# Patient Record
Sex: Female | Born: 1965 | Hispanic: Yes | Marital: Married | Smoking: Never smoker
Health system: Southern US, Community
[De-identification: ages and names within clinical notes are randomized; demographics above are authoritative.]

## PROBLEM LIST (undated history)

## (undated) DIAGNOSIS — M1990 Unspecified osteoarthritis, unspecified site: Secondary | ICD-10-CM

## (undated) DIAGNOSIS — F32A Depression, unspecified: Secondary | ICD-10-CM

## (undated) DIAGNOSIS — M549 Dorsalgia, unspecified: Secondary | ICD-10-CM

## (undated) DIAGNOSIS — F419 Anxiety disorder, unspecified: Secondary | ICD-10-CM

## (undated) DIAGNOSIS — E559 Vitamin D deficiency, unspecified: Secondary | ICD-10-CM

## (undated) DIAGNOSIS — M25579 Pain in unspecified ankle and joints of unspecified foot: Secondary | ICD-10-CM

## (undated) HISTORY — PX: FOOT SURGERY: SHX648

## (undated) HISTORY — PX: OTHER SURGICAL HISTORY: SHX169

## (undated) HISTORY — PX: TOOTH EXTRACTION: SUR596

---

## 2005-02-23 ENCOUNTER — Ambulatory Visit: Payer: Self-pay | Admitting: Family Medicine

## 2005-03-12 ENCOUNTER — Encounter: Payer: Self-pay | Admitting: Family Medicine

## 2005-04-08 ENCOUNTER — Encounter: Payer: Self-pay | Admitting: Family Medicine

## 2005-05-09 ENCOUNTER — Encounter: Payer: Self-pay | Admitting: Family Medicine

## 2005-07-31 ENCOUNTER — Ambulatory Visit: Payer: Self-pay | Admitting: Orthopedic Surgery

## 2005-08-24 ENCOUNTER — Encounter: Admission: RE | Admit: 2005-08-24 | Discharge: 2005-08-24 | Payer: Self-pay | Admitting: Neurological Surgery

## 2007-04-27 ENCOUNTER — Ambulatory Visit: Payer: Self-pay | Admitting: Emergency Medicine

## 2008-03-23 ENCOUNTER — Inpatient Hospital Stay: Payer: Self-pay | Admitting: Specialist

## 2008-04-07 ENCOUNTER — Emergency Department: Payer: Self-pay | Admitting: Emergency Medicine

## 2008-04-13 ENCOUNTER — Emergency Department: Payer: Self-pay | Admitting: Emergency Medicine

## 2009-03-27 ENCOUNTER — Ambulatory Visit: Payer: Self-pay | Admitting: Obstetrics and Gynecology

## 2011-04-08 ENCOUNTER — Ambulatory Visit: Payer: Self-pay | Admitting: Family Medicine

## 2014-05-27 ENCOUNTER — Ambulatory Visit
Admit: 2014-05-27 | Disposition: A | Discharge status: Home / Self Care | Payer: Self-pay | Attending: Family Medicine | Admitting: Family Medicine

## 2014-05-30 ENCOUNTER — Other Ambulatory Visit: Payer: Self-pay | Admitting: Family Medicine

## 2014-05-30 DIAGNOSIS — N63 Unspecified lump in unspecified breast: Secondary | ICD-10-CM

## 2014-06-17 ENCOUNTER — Ambulatory Visit
Admission: RE | Admit: 2014-06-17 | Discharge: 2014-06-17 | Disposition: A | Discharge status: Home / Self Care | Payer: BC Managed Care – PPO | Source: Ambulatory Visit | Attending: Family Medicine | Admitting: Family Medicine

## 2014-06-17 DIAGNOSIS — N63 Unspecified lump in unspecified breast: Secondary | ICD-10-CM

## 2014-06-17 DIAGNOSIS — R928 Other abnormal and inconclusive findings on diagnostic imaging of breast: Secondary | ICD-10-CM | POA: Diagnosis present

## 2015-09-11 ENCOUNTER — Encounter: Payer: Self-pay | Admitting: *Deleted

## 2015-09-12 ENCOUNTER — Encounter: Payer: Self-pay | Admitting: *Deleted

## 2015-09-12 ENCOUNTER — Encounter
Admission: RE | Disposition: A | Discharge status: Home / Self Care | Payer: Self-pay | Source: Ambulatory Visit | Attending: Gastroenterology

## 2015-09-12 ENCOUNTER — Ambulatory Visit: Payer: BC Managed Care – PPO | Admitting: Anesthesiology

## 2015-09-12 ENCOUNTER — Ambulatory Visit
Admission: RE | Admit: 2015-09-12 | Discharge: 2015-09-12 | Disposition: A | Discharge status: Home / Self Care | Payer: BC Managed Care – PPO | Source: Ambulatory Visit | Attending: Gastroenterology | Admitting: Gastroenterology

## 2015-09-12 DIAGNOSIS — K640 First degree hemorrhoids: Secondary | ICD-10-CM | POA: Insufficient documentation

## 2015-09-12 DIAGNOSIS — D120 Benign neoplasm of cecum: Secondary | ICD-10-CM | POA: Insufficient documentation

## 2015-09-12 DIAGNOSIS — Z1211 Encounter for screening for malignant neoplasm of colon: Secondary | ICD-10-CM | POA: Insufficient documentation

## 2015-09-12 DIAGNOSIS — E559 Vitamin D deficiency, unspecified: Secondary | ICD-10-CM | POA: Insufficient documentation

## 2015-09-12 HISTORY — PX: COLONOSCOPY WITH PROPOFOL: SHX5780

## 2015-09-12 HISTORY — DX: Pain in unspecified ankle and joints of unspecified foot: M25.579

## 2015-09-12 HISTORY — DX: Dorsalgia, unspecified: M54.9

## 2015-09-12 HISTORY — DX: Vitamin D deficiency, unspecified: E55.9

## 2015-09-12 SURGERY — COLONOSCOPY WITH PROPOFOL
Anesthesia: General

## 2015-09-12 MED ORDER — FENTANYL CITRATE (PF) 100 MCG/2ML IJ SOLN
INTRAMUSCULAR | Status: DC | PRN
  Administered 2015-09-12: 50 ug via INTRAVENOUS

## 2015-09-12 MED ORDER — SODIUM CHLORIDE 0.9 % IV SOLN
INTRAVENOUS | Status: DC
  Administered 2015-09-12: 1000 mL via INTRAVENOUS

## 2015-09-12 MED ORDER — EPHEDRINE SULFATE 50 MG/ML IJ SOLN
INTRAMUSCULAR | Status: DC | PRN
  Administered 2015-09-12 (×2): 5 mg via INTRAVENOUS

## 2015-09-12 MED ORDER — MIDAZOLAM HCL 2 MG/2ML IJ SOLN
INTRAMUSCULAR | Status: DC | PRN
  Administered 2015-09-12: 1 mg via INTRAVENOUS

## 2015-09-12 MED ORDER — SODIUM CHLORIDE 0.9 % IV SOLN: INTRAVENOUS | Status: DC

## 2015-09-12 MED ORDER — PROPOFOL 500 MG/50ML IV EMUL
INTRAVENOUS | Status: DC | PRN
  Administered 2015-09-12: 100 ug/kg/min via INTRAVENOUS

## 2015-09-12 NOTE — Anesthesia Preprocedure Evaluation (Signed)
Anesthesia Evaluation  Patient identified by MRN, date of birth, ID band Patient awake    Reviewed: Allergy & Precautions, NPO status , Patient's Chart, lab work & pertinent test results  Airway Mallampati: II       Dental  (+) Teeth Intact   Pulmonary neg pulmonary ROS,    Pulmonary exam normal        Cardiovascular negative cardio ROS   Rhythm:Regular     Neuro/Psych negative neurological ROS     GI/Hepatic negative GI ROS, Neg liver ROS,   Endo/Other  negative endocrine ROS  Renal/GU negative Renal ROS     Musculoskeletal negative musculoskeletal ROS (+)   Abdominal Normal abdominal exam  (+)   Peds negative pediatric ROS (+)  Hematology negative hematology ROS (+)   Anesthesia Other Findings   Reproductive/Obstetrics                             Anesthesia Physical Anesthesia Plan  ASA: I  Anesthesia Plan: General   Post-op Pain Management:    Induction: Intravenous  Airway Management Planned: Natural Airway and Nasal Cannula  Additional Equipment:   Intra-op Plan:   Post-operative Plan:   Informed Consent: I have reviewed the patients History and Physical, chart, labs and discussed the procedure including the risks, benefits and alternatives for the proposed anesthesia with the patient or authorized representative who has indicated his/her understanding and acceptance.     Plan Discussed with: CRNA  Anesthesia Plan Comments:         Anesthesia Quick Evaluation

## 2015-09-12 NOTE — H&P (Signed)
Outpatient short stay form Pre-procedure 09/12/2015 9:43 AM Lollie Sails MD  Primary Physician: Dr. Gayland Curry  Reason for visit:  Colonoscopy  History of present illness:  Patient is a 49 year old female presenting today for her initial colonoscopy. She tolerated her prep well. She takes no aspirin products or blood thinning agents.    Current Facility-Administered Medications:  .  0.9 %  sodium chloride infusion, , Intravenous, Continuous, Lollie Sails, MD, Last Rate: 20 mL/hr at 09/12/15 0941, 1,000 mL at 09/12/15 0941 .  0.9 %  sodium chloride infusion, , Intravenous, Continuous, Lollie Sails, MD  Prescriptions Prior to Admission  Medication Sig Dispense Refill Last Dose  . acetaminophen (TYLENOL) 650 MG CR tablet Take 650 mg by mouth every 8 (eight) hours as needed for pain.     Marland Kitchen ibuprofen (ADVIL,MOTRIN) 200 MG tablet Take 200 mg by mouth every 6 (six) hours as needed.        No Known Allergies   Past Medical History:  Diagnosis Date  . Ankle pain   . Back pain   . Vitamin D deficiency     Review of systems:      Physical Exam    Heart and lungs: Regular rate and rhythm without rub or gallop, lungs are bilaterally clear.    HEENT: Normocephalic atraumatic eyes are anicteric    Other:     Pertinant exam for procedure: Soft nontender nondistended bowel sounds positive normoactive.    Planned proceedures: Colonoscopy and indicated procedures. I have discussed the risks benefits and complications of procedures to include not limited to bleeding, infection, perforation and the risk of sedation and the patient wishes to proceed.    Lollie Sails, MD Gastroenterology 09/12/2015  9:43 AM

## 2015-09-12 NOTE — Anesthesia Procedure Notes (Signed)
Performed by: COOK-MARTIN, Riya Huxford Pre-anesthesia Checklist: Patient identified, Emergency Drugs available, Suction available, Patient being monitored and Timeout performed Patient Re-evaluated:Patient Re-evaluated prior to inductionOxygen Delivery Method: Nasal cannula Preoxygenation: Pre-oxygenation with 100% oxygen Intubation Type: IV induction Placement Confirmation: CO2 detector and positive ETCO2       

## 2015-09-12 NOTE — Transfer of Care (Signed)
Immediate Anesthesia Transfer of Care Note  Patient: Whitney Mcgee  Procedure(s) Performed: Procedure(s): COLONOSCOPY WITH PROPOFOL (N/A)  Patient Location: PACU  Anesthesia Type:General  Level of Consciousness: awake and sedated  Airway & Oxygen Therapy: Patient Spontanous Breathing and Patient connected to nasal cannula oxygen  Post-op Assessment: Report given to RN and Post -op Vital signs reviewed and stable  Post vital signs: Reviewed and stable  Last Vitals:  Vitals:   09/12/15 0925  BP: 100/62  Pulse: 73  Resp: 16  Temp: 36.7 C    Last Pain:  Vitals:   09/12/15 0925  TempSrc: Tympanic         Complications: No apparent anesthesia complications

## 2015-09-12 NOTE — Anesthesia Postprocedure Evaluation (Signed)
Anesthesia Post Note  Patient: Whitney Mcgee  Procedure(s) Performed: Procedure(s) (LRB): COLONOSCOPY WITH PROPOFOL (N/A)  Patient location during evaluation: PACU Anesthesia Type: General Level of consciousness: awake Pain management: pain level controlled Vital Signs Assessment: post-procedure vital signs reviewed and stable Respiratory status: nonlabored ventilation Cardiovascular status: stable Anesthetic complications: no    Last Vitals:  Vitals:   09/12/15 0925  BP: 100/62  Pulse: 73  Resp: 16  Temp: 36.7 C    Last Pain:  Vitals:   09/12/15 0925  TempSrc: Tympanic                 VAN STAVEREN,Delitha Elms

## 2015-09-12 NOTE — Op Note (Signed)
[facility] Gastroenterology Patient Name: Whitney Mcgee Procedure Date: 09/12/2015 9:50 AM MRN: [id number] Account #: [id number] Date of Birth: September 15, 1965 Admit Type: Outpatient Age: 50 Room: [facility] ENDO ROOM 3 Gender: Female Note Status: Finalized Procedure:            Colonoscopy Indications:          Screening for colorectal malignant neoplasm, This is                        the patient's first colonoscopy Providers:            Lollie Sails, MD Referring MD:         Gayland Curry MD, MD (Referring MD) Medicines:            Monitored Anesthesia Care Complications:        No immediate complications. Procedure:            Pre-Anesthesia Assessment:                       - ASA Grade Assessment: II - A patient with mild                        systemic disease.                       After obtaining informed consent, the colonoscope was                        passed under direct vision. Throughout the procedure,                        the patient's blood pressure, pulse, and oxygen                        saturations were monitored continuously. The                        Colonoscope was introduced through the anus and                        advanced to the the cecum, identified by appendiceal                        orifice and ileocecal valve. The colonoscopy was                        performed with moderate difficulty. Successful                        completion of the procedure was aided by changing the                        patient to a supine position and using manual pressure.                        The patient tolerated the procedure well. The quality                        of the bowel preparation was good. Findings:      A 4 mm polyp was found in the cecum. The  polyp was sessile. The polyp       was removed with a cold biopsy forceps. Resection and retrieval were       complete.      The exam was otherwise normal throughout the examined colon.   Non-bleeding internal hemorrhoids were found during retroflexion. The       hemorrhoids were small and Grade I (internal hemorrhoids that do not       prolapse).      The exam was otherwise without abnormality. Impression:           - One 4 mm polyp in the cecum, removed with a cold                        biopsy forceps. Resected and retrieved.                       - Non-bleeding internal hemorrhoids.                       - The examination was otherwise normal. Recommendation:       - Resume regular diet. Procedure Code(s):    --- Professional ---                       [phone], Colonoscopy, flexible; with biopsy, single or                        multiple Diagnosis Code(s):    --- Professional ---                       Z12.11, Encounter for screening for malignant neoplasm                        of colon                       D12.0, Benign neoplasm of cecum                       K64.0, First degree hemorrhoids CPT copyright 2016 American Medical Association. All rights reserved. The codes documented in this report are preliminary and upon coder review may  be revised to meet current compliance requirements. Lollie Sails, MD 09/12/2015 10:19:29 AM This report has been signed electronically. Number of Addenda: 0 Note Initiated On: 09/12/2015 9:50 AM Scope Withdrawal Time: 0 hours 9 minutes 24 seconds  Total Procedure Duration: 0 hours 19 minutes 9 seconds       [facility]

## 2015-09-15 ENCOUNTER — Encounter: Payer: Self-pay | Admitting: Gastroenterology

## 2015-09-15 LAB — SURGICAL PATHOLOGY

## 2017-03-02 ENCOUNTER — Other Ambulatory Visit: Payer: Self-pay | Admitting: Family Medicine

## 2017-03-02 DIAGNOSIS — Z1231 Encounter for screening mammogram for malignant neoplasm of breast: Secondary | ICD-10-CM

## 2017-03-16 ENCOUNTER — Ambulatory Visit
Admission: RE | Admit: 2017-03-16 | Discharge: 2017-03-16 | Disposition: A | Discharge status: Home / Self Care | Payer: BC Managed Care – PPO | Source: Ambulatory Visit | Attending: Family Medicine | Admitting: Family Medicine

## 2017-03-16 DIAGNOSIS — Z1231 Encounter for screening mammogram for malignant neoplasm of breast: Secondary | ICD-10-CM

## 2019-05-02 ENCOUNTER — Other Ambulatory Visit: Payer: Self-pay | Admitting: Family Medicine

## 2019-05-02 DIAGNOSIS — Z1231 Encounter for screening mammogram for malignant neoplasm of breast: Secondary | ICD-10-CM

## 2019-05-15 ENCOUNTER — Ambulatory Visit
Admission: RE | Admit: 2019-05-15 | Discharge: 2019-05-15 | Disposition: A | Discharge status: Home / Self Care | Payer: BC Managed Care – PPO | Source: Ambulatory Visit | Attending: Family Medicine | Admitting: Family Medicine

## 2019-05-15 ENCOUNTER — Other Ambulatory Visit: Payer: Self-pay

## 2019-05-15 DIAGNOSIS — Z1231 Encounter for screening mammogram for malignant neoplasm of breast: Secondary | ICD-10-CM | POA: Insufficient documentation

## 2020-06-13 ENCOUNTER — Ambulatory Visit
Admission: EM | Admit: 2020-06-13 | Discharge: 2020-06-13 | Disposition: A | Discharge status: Home / Self Care | Payer: BC Managed Care – PPO | Attending: Family Medicine | Admitting: Family Medicine

## 2020-06-13 ENCOUNTER — Other Ambulatory Visit: Payer: Self-pay

## 2020-06-13 ENCOUNTER — Encounter: Payer: Self-pay | Admitting: Emergency Medicine

## 2020-06-13 DIAGNOSIS — J029 Acute pharyngitis, unspecified: Secondary | ICD-10-CM

## 2020-06-13 DIAGNOSIS — Z20822 Contact with and (suspected) exposure to covid-19: Secondary | ICD-10-CM

## 2020-06-13 NOTE — ED Triage Notes (Signed)
Patient states that her daughter tested positive for COVID today. Patient states that she only has a scratchy throat.  Patient denies fevers.

## 2020-06-13 NOTE — Discharge Instructions (Signed)
Stay home until testing has returned.  OTC treatment with Ibuprofen as needed.  Take care  Dr. Lacinda Axon

## 2020-06-13 NOTE — ED Provider Notes (Signed)
[facility]    CSN: [id number] Arrival date & time: 06/13/20  1030      History   Chief Complaint Chief Complaint  Patient presents with  . Covid Exposure   HPI  55 year old female presents with the above complaint.  Patient reports that her daughter has recently tested positive for COVID-19.  She has recently developed a sore throat.  She describes it as scratchy.  She is otherwise feeling well.  No other respiratory symptoms.  Desires COVID testing today.  No other complaints or concerns at this time.  Past Medical History:  Diagnosis Date  . Ankle pain   . Back pain   . Vitamin D deficiency    Past Surgical History:  Procedure Laterality Date  . COLONOSCOPY WITH PROPOFOL N/A 09/12/2015   Procedure: COLONOSCOPY WITH PROPOFOL;  Surgeon: Lollie Sails, MD;  Location: [facility] ENDOSCOPY;  Service: Endoscopy;  Laterality: N/A;  . E-Sure for btl    . FOOT SURGERY      OB History   No obstetric history on file.      Home Medications    Prior to Admission medications   Medication Sig Start Date End Date Taking? Authorizing Provider  escitalopram (LEXAPRO) 5 MG tablet Take 5 mg by mouth daily. 06/09/20  Yes [provider]  hydrOXYzine (ATARAX/VISTARIL) 25 MG tablet Take 25 mg by mouth 3 (three) times daily. 06/09/20  Yes [provider]  omeprazole (PRILOSEC) 40 MG capsule Take 1 capsule by mouth daily. 05/19/20  Yes [provider]  sucralfate (CARAFATE) 1 g tablet Take 1 g by mouth 4 (four) times daily. 05/19/20  Yes [provider]  acetaminophen (TYLENOL) 650 MG CR tablet Take 650 mg by mouth every 8 (eight) hours as needed for pain.    [provider]  ibuprofen (ADVIL,MOTRIN) 200 MG tablet Take 200 mg by mouth every 6 (six) hours as needed.    [provider]    Family History Family History  Problem Relation Age of Onset  . Breast cancer Neg Hx     Social History Social History   Tobacco Use  .  Smoking status: Never Smoker  . Smokeless tobacco: Never Used  Vaping Use  . Vaping Use: Never used  Substance Use Topics  . Alcohol use: Not Currently  . Drug use: Never     Allergies   Patient has no known allergies.   Review of Systems Review of Systems  Constitutional: Negative.   HENT: Positive for sore throat.    Physical Exam Triage Vital Signs ED Triage Vitals  Enc Vitals Group     BP 06/13/20 1104 97/67     Pulse Rate 06/13/20 1104 81     Resp 06/13/20 1104 14     Temp 06/13/20 1104 98.2 F (36.8 C)     Temp Source 06/13/20 1104 Oral     SpO2 06/13/20 1104 99 %     Weight 06/13/20 1100 150 lb (68 kg)     Height 06/13/20 1100 5\' 4"  (1.626 m)     Head Circumference --      Peak Flow --      Pain Score 06/13/20 1100 0     Pain Loc --      Pain Edu? --      Excl. in [place]? --    Updated Vital Signs BP 97/67 (BP Location: Left Arm)   Pulse 81   Temp 98.2 F (36.8 C) (Oral)   Resp  14   Ht 5\' 4"  (1.626 m)   Wt 68 kg   SpO2 99%   BMI 25.75 kg/m   Visual Acuity Right Eye Distance:   Left Eye Distance:   Bilateral Distance:    Right Eye Near:   Left Eye Near:    Bilateral Near:     Physical Exam Vitals and nursing note reviewed.  Constitutional:      General: She is not in acute distress.    Appearance: Normal appearance. She is not ill-appearing.  HENT:     Head: Normocephalic and atraumatic.     Mouth/Throat:     Pharynx: Oropharynx is clear. No oropharyngeal exudate or posterior oropharyngeal erythema.  Cardiovascular:     Rate and Rhythm: Normal rate and regular rhythm.     Heart sounds: No murmur heard.   Pulmonary:     Effort: Pulmonary effort is normal.     Breath sounds: Normal breath sounds. No wheezing or rales.  Neurological:     Mental Status: She is alert.  Psychiatric:        Mood and Affect: Mood normal.        Behavior: Behavior normal.    UC Treatments / Results  Labs (all labs ordered are listed, but only abnormal  results are displayed) Labs Reviewed  SARS CORONAVIRUS 2 (TAT 6-24 HRS)    EKG   Radiology No results found.  Procedures Procedures (including critical care time)  Medications Ordered in UC Medications - No data to display  Initial Impression / Assessment and Plan / UC Course  I have reviewed the triage vital signs and the nursing notes.  Pertinent labs & imaging results that were available during my care of the patient were reviewed by me and considered in my medical decision making (see chart for details).    55 year old female presents with sore throat.  Recent COVID exposure.  Well-appearing on exam.  Awaiting COVID test results.  Note given regarding work.  Discussed quarantine if positive.  Advised over-the-counter treatment with ibuprofen.  Supportive care.  Final Clinical Impressions(s) / UC Diagnoses   Final diagnoses:  Sore throat  Close exposure to COVID-19 virus     Discharge Instructions     Stay home until testing has returned.  OTC treatment with Ibuprofen as needed.  Take care  Dr. Lacinda Axon    ED Prescriptions    None     PDMP not reviewed this encounter.   Coral Spikes, [place] 06/13/20 1313

## 2020-06-14 LAB — SARS CORONAVIRUS 2 (TAT 6-24 HRS): SARS Coronavirus 2: NEGATIVE

## 2021-01-16 ENCOUNTER — Other Ambulatory Visit: Payer: Self-pay | Admitting: Podiatry

## 2021-01-16 DIAGNOSIS — M19171 Post-traumatic osteoarthritis, right ankle and foot: Secondary | ICD-10-CM

## 2021-02-11 ENCOUNTER — Other Ambulatory Visit: Payer: Self-pay

## 2021-02-11 ENCOUNTER — Ambulatory Visit
Admission: RE | Admit: 2021-02-11 | Discharge: 2021-02-11 | Disposition: A | Discharge status: Home / Self Care | Payer: BC Managed Care – PPO | Source: Ambulatory Visit | Attending: Podiatry | Admitting: Podiatry

## 2021-02-11 DIAGNOSIS — M19171 Post-traumatic osteoarthritis, right ankle and foot: Secondary | ICD-10-CM | POA: Insufficient documentation

## 2021-04-07 ENCOUNTER — Other Ambulatory Visit: Payer: Self-pay | Admitting: Family Medicine

## 2021-04-07 DIAGNOSIS — Z1231 Encounter for screening mammogram for malignant neoplasm of breast: Secondary | ICD-10-CM

## 2021-04-07 DIAGNOSIS — Z780 Asymptomatic menopausal state: Secondary | ICD-10-CM

## 2021-04-28 ENCOUNTER — Other Ambulatory Visit: Payer: Self-pay | Admitting: Podiatry

## 2021-04-30 ENCOUNTER — Other Ambulatory Visit: Payer: Self-pay

## 2021-04-30 ENCOUNTER — Other Ambulatory Visit
Admission: RE | Admit: 2021-04-30 | Discharge: 2021-04-30 | Disposition: A | Discharge status: Home / Self Care | Payer: BC Managed Care – PPO | Source: Ambulatory Visit | Attending: Podiatry | Admitting: Podiatry

## 2021-04-30 HISTORY — DX: Depression, unspecified: F32.A

## 2021-04-30 HISTORY — DX: Anxiety disorder, unspecified: F41.9

## 2021-04-30 HISTORY — DX: Unspecified osteoarthritis, unspecified site: M19.90

## 2021-04-30 NOTE — Patient Instructions (Addendum)
Your procedure is scheduled on: 05/08/21 - Friday ?Report to the Registration Desk on the 1st floor of the [facility]. ?To find out your arrival time, please call [phone] between 1PM - 3PM on: 05/07/21 - Thursday ? ?REMEMBER: ?Instructions that are not followed completely may result in serious medical risk, up to and including death; or upon the discretion of your surgeon and anesthesiologist your surgery may need to be rescheduled. ? ?Do not eat food after midnight the night before surgery.  ?No gum chewing, lozengers or hard candies. ? ?You may however, drink CLEAR liquids up to 2 hours before you are scheduled to arrive for your surgery. Do not drink anything within 2 hours of your scheduled arrival time. ? ?Clear liquids include: ?- water  ?- apple juice without pulp ?- gatorade (not RED colors) ?- black coffee or tea (Do NOT add milk or creamers to the coffee or tea) ?Do NOT drink anything that is not on this list. ? ?In addition, your doctor has ordered for you to drink the provided  ?Ensure Pre-Surgery Clear Carbohydrate Drink  ?Drinking this carbohydrate drink up to two hours before surgery helps to reduce insulin resistance and improve patient outcomes. Please complete drinking 2 hours prior to scheduled arrival time. ? ?TAKE THESE MEDICATIONS THE MORNING OF SURGERY WITH A SIP OF WATER: ?- escitalopram (LEXAPRO) 5 MG tablet ? ?One week prior to surgery: ?Stop Anti-inflammatories (NSAIDS) such as Advil, Aleve, Ibuprofen, Motrin, Naproxen, Naprosyn and Aspirin based products such as Excedrin, Goodys Powder, BC Powder. ? ?Stop ANY OVER THE COUNTER supplements until after surgery. ? ?You may however, continue to take Tylenol if needed for pain up until the day of surgery. ? ?No Alcohol for 24 hours before or after surgery. ? ?No Smoking including e-cigarettes for 24 hours prior to surgery.  ?No chewable tobacco products for at least 6 hours prior to surgery.  ?No nicotine patches on the day of  surgery. ? ?Do not use any "recreational" drugs for at least a week prior to your surgery.  ?Please be advised that the combination of cocaine and anesthesia may have negative outcomes, up to and including death. ?If you test positive for cocaine, your surgery will be cancelled. ? ?On the morning of surgery brush your teeth with toothpaste and water, you may rinse your mouth with mouthwash if you wish. ?Do not swallow any toothpaste or mouthwash. ? ?Use CHG Soap or wipes as directed on instruction sheet. ? ?Do not wear jewelry, make-up, hairpins, clips or nail polish. ? ?Do not wear lotions, powders, or perfumes.  ? ?Do not shave body from the neck down 48 hours prior to surgery just in case you cut yourself which could leave a site for infection.  ?Also, freshly shaved skin may become irritated if using the CHG soap. ? ?Contact lenses, hearing aids and dentures may not be worn into surgery. ? ?Do not bring valuables to the hospital. [facility] is not responsible for any missing/lost belongings or valuables.  ? ?Notify your doctor if there is any change in your medical condition (cold, fever, infection). ? ?Wear comfortable clothing (specific to your surgery type) to the hospital. ? ?After surgery, you can help prevent lung complications by doing breathing exercises.  ?Take deep breaths and cough every 1-2 hours. Your doctor may order a device called an Incentive Spirometer to help you take deep breaths. ?When coughing or sneezing, hold a pillow firmly against your incision with both hands. This is called ?  splinting.? Doing this helps protect your incision. It also decreases belly discomfort. ? ?If you are being admitted to the hospital overnight, leave your suitcase in the car. ?After surgery it may be brought to your room. ? ?If you are being discharged the day of surgery, you will not be allowed to drive home. ?You will need a responsible adult (18 years or older) to drive you home and stay with you that night.   ? ?If you are taking public transportation, you will need to have a responsible adult (18 years or older) with you. ?Please confirm with your physician that it is acceptable to use public transportation.  ? ?Please call the [place] Dept. at [phone] if you have any questions about these instructions. ? ?Surgery Visitation Policy: ? ?Patients undergoing a surgery or procedure may have two family members or support persons with them as long as the person is not COVID-19 positive or experiencing its symptoms.  ? ?Inpatient Visitation:   ? ?Visiting hours are 7 a.m. to 8 p.m. ?Up to four visitors are allowed at one time in a patient room, including children. The visitors may rotate out with other people during the day. One designated support person (adult) may remain overnight. ? ?All Areas: ?All visitors must pass COVID-19 screenings, use hand sanitizer when entering and exiting the patient?s room and wear a mask at all times, including in the patient?s room. ?Patients must also wear a mask when staff or their visitor are in the room. ?Masking is required regardless of vaccination status.  ?

## 2021-05-07 MED ORDER — ORAL CARE MOUTH RINSE: 15.0000 mL | Freq: Once | OROMUCOSAL | Status: AC

## 2021-05-07 MED ORDER — FAMOTIDINE 20 MG PO TABS
20.0000 mg | ORAL_TABLET | Freq: Once | ORAL | Status: AC
Start: 2021-05-07 — End: 2021-05-08

## 2021-05-07 MED ORDER — CEFAZOLIN SODIUM-DEXTROSE 2-4 GM/100ML-% IV SOLN
2.0000 g | INTRAVENOUS | Status: AC
  Administered 2021-05-08: 2 g via INTRAVENOUS

## 2021-05-07 MED ORDER — LACTATED RINGERS IV SOLN: INTRAVENOUS | Status: DC

## 2021-05-07 MED ORDER — CHLORHEXIDINE GLUCONATE 0.12 % MT SOLN: 15.0000 mL | Freq: Once | OROMUCOSAL | Status: AC

## 2021-05-08 ENCOUNTER — Ambulatory Visit: Payer: BC Managed Care – PPO

## 2021-05-08 ENCOUNTER — Ambulatory Visit: Payer: BC Managed Care – PPO | Admitting: Anesthesiology

## 2021-05-08 ENCOUNTER — Encounter
Admission: RE | Disposition: A | Discharge status: Home / Self Care | Payer: Self-pay | Source: Ambulatory Visit | Attending: Podiatry

## 2021-05-08 ENCOUNTER — Encounter: Payer: Self-pay | Admitting: Podiatry

## 2021-05-08 ENCOUNTER — Ambulatory Visit
Admission: RE | Admit: 2021-05-08 | Discharge: 2021-05-08 | Disposition: A | Discharge status: Home / Self Care | Payer: BC Managed Care – PPO | Source: Ambulatory Visit | Attending: Podiatry | Admitting: Podiatry

## 2021-05-08 ENCOUNTER — Other Ambulatory Visit: Payer: Self-pay

## 2021-05-08 DIAGNOSIS — X58XXXA Exposure to other specified factors, initial encounter: Secondary | ICD-10-CM | POA: Insufficient documentation

## 2021-05-08 DIAGNOSIS — M19071 Primary osteoarthritis, right ankle and foot: Secondary | ICD-10-CM | POA: Insufficient documentation

## 2021-05-08 DIAGNOSIS — F418 Other specified anxiety disorders: Secondary | ICD-10-CM | POA: Insufficient documentation

## 2021-05-08 DIAGNOSIS — Z87891 Personal history of nicotine dependence: Secondary | ICD-10-CM | POA: Insufficient documentation

## 2021-05-08 DIAGNOSIS — M899 Disorder of bone, unspecified: Secondary | ICD-10-CM | POA: Insufficient documentation

## 2021-05-08 DIAGNOSIS — G5751 Tarsal tunnel syndrome, right lower limb: Secondary | ICD-10-CM | POA: Insufficient documentation

## 2021-05-08 DIAGNOSIS — T8484XA Pain due to internal orthopedic prosthetic devices, implants and grafts, initial encounter: Secondary | ICD-10-CM | POA: Diagnosis not present

## 2021-05-08 HISTORY — PX: FOOT ARTHRODESIS: SHX1655

## 2021-05-08 HISTORY — PX: TARSAL TUNNEL RELEASE: SHX5042

## 2021-05-08 HISTORY — PX: ANKLE ARTHROSCOPY: SHX545

## 2021-05-08 SURGERY — ARTHROSCOPY, ANKLE
Anesthesia: General | Site: Foot | Laterality: Right

## 2021-05-08 MED ORDER — STERILE WATER FOR INJECTION IJ SOLN
INTRAMUSCULAR | Status: AC
  Filled 2021-05-08: qty 10

## 2021-05-08 MED ORDER — FENTANYL CITRATE PF 50 MCG/ML IJ SOSY: 50.0000 ug | PREFILLED_SYRINGE | Freq: Once | INTRAMUSCULAR | Status: DC

## 2021-05-08 MED ORDER — LIDOCAINE-EPINEPHRINE (PF) 1 %-1:200000 IJ SOLN
INTRAMUSCULAR | Status: AC
  Filled 2021-05-08: qty 30

## 2021-05-08 MED ORDER — OXYCODONE HCL 5 MG PO TABS: 5.0000 mg | ORAL_TABLET | Freq: Once | ORAL | Status: AC | PRN

## 2021-05-08 MED ORDER — CEFAZOLIN SODIUM-DEXTROSE 2-4 GM/100ML-% IV SOLN
INTRAVENOUS | Status: AC
  Filled 2021-05-08: qty 100

## 2021-05-08 MED ORDER — FENTANYL CITRATE PF 50 MCG/ML IJ SOSY
PREFILLED_SYRINGE | INTRAMUSCULAR | Status: AC
  Filled 2021-05-08: qty 1

## 2021-05-08 MED ORDER — LACTATED RINGERS IV SOLN
INTRAVENOUS | Status: DC | PRN
Start: 2021-05-08 — End: 2021-05-08

## 2021-05-08 MED ORDER — ONDANSETRON HCL 4 MG/2ML IJ SOLN
INTRAMUSCULAR | Status: AC
  Filled 2021-05-08: qty 2

## 2021-05-08 MED ORDER — EPHEDRINE SULFATE (PRESSORS) 50 MG/ML IJ SOLN
INTRAMUSCULAR | Status: DC | PRN
  Administered 2021-05-08: 5 mg via INTRAVENOUS

## 2021-05-08 MED ORDER — LIDOCAINE HCL (PF) 2 % IJ SOLN
INTRAMUSCULAR | Status: AC
  Filled 2021-05-08: qty 5

## 2021-05-08 MED ORDER — DEXAMETHASONE SODIUM PHOSPHATE 4 MG/ML IJ SOLN
INTRAMUSCULAR | Status: AC
  Filled 2021-05-08: qty 1

## 2021-05-08 MED ORDER — ROPIVACAINE HCL 5 MG/ML IJ SOLN
INTRAMUSCULAR | Status: DC | PRN
  Administered 2021-05-08: 15 mL via PERINEURAL

## 2021-05-08 MED ORDER — ONDANSETRON HCL 4 MG/2ML IJ SOLN: 4.0000 mg | Freq: Once | INTRAMUSCULAR | Status: DC | PRN

## 2021-05-08 MED ORDER — BUPIVACAINE HCL (PF) 0.5 % IJ SOLN
INTRAMUSCULAR | Status: AC
  Filled 2021-05-08: qty 30

## 2021-05-08 MED ORDER — LIDOCAINE-EPINEPHRINE 1 %-1:100000 IJ SOLN
INTRAMUSCULAR | Status: AC
  Filled 2021-05-08: qty 1

## 2021-05-08 MED ORDER — ROPIVACAINE HCL 5 MG/ML IJ SOLN
INTRAMUSCULAR | Status: AC
  Filled 2021-05-08: qty 30

## 2021-05-08 MED ORDER — FENTANYL CITRATE (PF) 100 MCG/2ML IJ SOLN
INTRAMUSCULAR | Status: AC
  Administered 2021-05-08: 25 ug via INTRAVENOUS
  Filled 2021-05-08: qty 2

## 2021-05-08 MED ORDER — FENTANYL CITRATE (PF) 100 MCG/2ML IJ SOLN
INTRAMUSCULAR | Status: AC
  Filled 2021-05-08: qty 2

## 2021-05-08 MED ORDER — DEXAMETHASONE SODIUM PHOSPHATE 4 MG/ML IJ SOLN
INTRAMUSCULAR | Status: DC | PRN
  Administered 2021-05-08: 2 mg via PERINEURAL

## 2021-05-08 MED ORDER — CHLORHEXIDINE GLUCONATE 0.12 % MT SOLN
OROMUCOSAL | Status: AC
  Administered 2021-05-08: 15 mL via OROMUCOSAL
  Filled 2021-05-08: qty 15

## 2021-05-08 MED ORDER — PROPOFOL 10 MG/ML IV BOLUS
INTRAVENOUS | Status: DC | PRN
  Administered 2021-05-08: 150 mg via INTRAVENOUS

## 2021-05-08 MED ORDER — ACETAMINOPHEN 10 MG/ML IV SOLN: 1000.0000 mg | Freq: Once | INTRAVENOUS | Status: DC | PRN

## 2021-05-08 MED ORDER — OXYCODONE HCL 5 MG PO TABS
ORAL_TABLET | ORAL | Status: AC
  Administered 2021-05-08: 5 mg via ORAL
  Filled 2021-05-08: qty 1

## 2021-05-08 MED ORDER — MIDAZOLAM HCL 2 MG/2ML IJ SOLN
INTRAMUSCULAR | Status: AC
  Administered 2021-05-08: 2 mg via INTRAVENOUS
  Filled 2021-05-08: qty 2

## 2021-05-08 MED ORDER — FAMOTIDINE 20 MG PO TABS
ORAL_TABLET | ORAL | Status: AC
  Administered 2021-05-08: 20 mg via ORAL
  Filled 2021-05-08: qty 1

## 2021-05-08 MED ORDER — BUPIVACAINE-EPINEPHRINE (PF) 0.25% -1:200000 IJ SOLN
INTRAMUSCULAR | Status: AC
Start: 2021-05-08 — End: ?
  Filled 2021-05-08: qty 30

## 2021-05-08 MED ORDER — ROCURONIUM BROMIDE 100 MG/10ML IV SOLN
INTRAVENOUS | Status: DC | PRN
Start: 2021-05-08 — End: 2021-05-08
  Administered 2021-05-08: 50 mg via INTRAVENOUS

## 2021-05-08 MED ORDER — BUPIVACAINE-EPINEPHRINE (PF) 0.25% -1:200000 IJ SOLN
INTRAMUSCULAR | Status: DC | PRN
  Administered 2021-05-08: 20 mL via PERINEURAL

## 2021-05-08 MED ORDER — ACETAMINOPHEN 10 MG/ML IV SOLN
INTRAVENOUS | Status: DC | PRN
Start: 2021-05-08 — End: 2021-05-08
  Administered 2021-05-08: 1000 mg via INTRAVENOUS

## 2021-05-08 MED ORDER — PROPOFOL 10 MG/ML IV BOLUS
INTRAVENOUS | Status: AC
  Filled 2021-05-08: qty 20

## 2021-05-08 MED ORDER — LIDOCAINE HCL (CARDIAC) PF 100 MG/5ML IV SOSY
PREFILLED_SYRINGE | INTRAVENOUS | Status: DC | PRN
  Administered 2021-05-08: 80 mg via INTRAVENOUS

## 2021-05-08 MED ORDER — FENTANYL CITRATE (PF) 100 MCG/2ML IJ SOLN
25.0000 ug | INTRAMUSCULAR | Status: DC | PRN
  Administered 2021-05-08 (×2): 25 ug via INTRAVENOUS

## 2021-05-08 MED ORDER — FENTANYL CITRATE (PF) 100 MCG/2ML IJ SOLN
INTRAMUSCULAR | Status: DC | PRN
  Administered 2021-05-08: 50 ug via INTRAVENOUS

## 2021-05-08 MED ORDER — MIDAZOLAM HCL 2 MG/2ML IJ SOLN
INTRAMUSCULAR | Status: AC
  Filled 2021-05-08: qty 2

## 2021-05-08 MED ORDER — ACETAMINOPHEN 10 MG/ML IV SOLN
INTRAVENOUS | Status: AC
  Filled 2021-05-08: qty 100

## 2021-05-08 MED ORDER — OXYCODONE HCL 5 MG/5ML PO SOLN: 5.0000 mg | Freq: Once | ORAL | Status: AC | PRN

## 2021-05-08 MED ORDER — SUGAMMADEX SODIUM 200 MG/2ML IV SOLN
INTRAVENOUS | Status: DC | PRN
  Administered 2021-05-08: 150 mg via INTRAVENOUS

## 2021-05-08 MED ORDER — LIDOCAINE HCL (PF) 1 % IJ SOLN
INTRAMUSCULAR | Status: AC
  Filled 2021-05-08: qty 30

## 2021-05-08 MED ORDER — ASPIRIN EC 325 MG PO TBEC: 325.0000 mg | DELAYED_RELEASE_TABLET | Freq: Every day | ORAL | 1 refills | Status: AC

## 2021-05-08 MED ORDER — MIDAZOLAM HCL 2 MG/2ML IJ SOLN: 1.0000 mg | Freq: Once | INTRAMUSCULAR | Status: AC

## 2021-05-08 MED ORDER — OXYCODONE-ACETAMINOPHEN 5-325 MG PO TABS: 1.0000 | ORAL_TABLET | Freq: Four times a day (QID) | ORAL | 0 refills | Status: AC | PRN

## 2021-05-08 SURGICAL SUPPLY — 97 items
ADAPTER IRRIG TUBE 2 SPIKE SOL (ADAPTER) ×8 IMPLANT
ARTHROWAND PARAGON T2 (SURGICAL WAND)
BIT DRILL 12.7X2STRG SHNK (BIT) IMPLANT
BIT DRILL 2.0 (BIT) ×4
BIT DRILL CANNULATED 4.6 (BIT) ×1 IMPLANT
BIT DRL 12.7X2STRG SHNK (BIT) ×3
BLADE FULL RADIUS 2.9 (BLADE) ×1 IMPLANT
BLADE SHAVER 2.9D 7 MINI (BLADE) ×5 IMPLANT
BLADE SURG 15 STRL LF DISP TIS (BLADE) ×6 IMPLANT
BLADE SURG 15 STRL SS (BLADE) ×20
BNDG COHESIVE 4X5 TAN ST LF (GAUZE/BANDAGES/DRESSINGS) ×4 IMPLANT
BNDG CONFORM 2 STRL LF (GAUZE/BANDAGES/DRESSINGS) ×4 IMPLANT
BNDG CONFORM 3 STRL LF (GAUZE/BANDAGES/DRESSINGS) ×4 IMPLANT
BNDG ELASTIC 4X5.8 VLCR NS LF (GAUZE/BANDAGES/DRESSINGS) ×8 IMPLANT
BNDG ESMARK 4X12 TAN STRL LF (GAUZE/BANDAGES/DRESSINGS) ×4 IMPLANT
BNDG GAUZE ELAST 4 BULKY (GAUZE/BANDAGES/DRESSINGS) ×4 IMPLANT
BOOT STEPPER DURA MED (SOFTGOODS) ×1 IMPLANT
BUR 4X45 EGG (BURR) ×5 IMPLANT
COUNTERSINK 7.0 (MISCELLANEOUS) ×4
COVER PIN YLW 0.028-062 (MISCELLANEOUS) ×8 IMPLANT
CUFF TOURN SGL QUICK 18X4 (TOURNIQUET CUFF) IMPLANT
CUFF TOURN SGL QUICK 24 (TOURNIQUET CUFF)
CUFF TRNQT CYL 24X4X16.5-23 (TOURNIQUET CUFF) IMPLANT
DRAPE ARTHRO LIMB 89X125 STRL (DRAPES) ×4 IMPLANT
DRAPE C-ARM XRAY 36X54 (DRAPES) ×4 IMPLANT
DRAPE C-ARMOR (DRAPES) ×4 IMPLANT
DRAPE FLUOR MINI C-ARM 54X84 (DRAPES) ×1 IMPLANT
DRAPE INCISE IOBAN 66X60 STRL (DRAPES) ×1 IMPLANT
DURAPREP 26ML APPLICATOR (WOUND CARE) ×4 IMPLANT
ELECT REM PT RETURN 9FT ADLT (ELECTROSURGICAL) ×4
ELECTRODE REM PT RTRN 9FT ADLT (ELECTROSURGICAL) ×3 IMPLANT
ETHIBOND 2 0 GREEN CT 2 30IN (SUTURE) ×4 IMPLANT
GAUZE 4X4 16PLY ~~LOC~~+RFID DBL (SPONGE) ×1 IMPLANT
GAUZE SPONGE 4X4 12PLY STRL (GAUZE/BANDAGES/DRESSINGS) ×4 IMPLANT
GAUZE XEROFORM 1X8 LF (GAUZE/BANDAGES/DRESSINGS) ×5 IMPLANT
GLOVE SURG ENC MOIS LTX SZ7.5 (GLOVE) ×4 IMPLANT
GLOVE SURG UNDER LTX SZ8 (GLOVE) ×4 IMPLANT
GOWN STRL REUS W/ TWL XL LVL3 (GOWN DISPOSABLE) ×6 IMPLANT
GOWN STRL REUS W/TWL XL LVL3 (GOWN DISPOSABLE) ×8
IV LACTATED RINGER IRRG 3000ML (IV SOLUTION) ×20
IV LR IRRIG 3000ML ARTHROMATIC (IV SOLUTION) ×15 IMPLANT
K-WIRE SINGLE TROCAR 2.3X230 (WIRE) ×12
KIT TURNOVER KIT A (KITS) ×4 IMPLANT
KWIRE SINGLE TROCAR 2.3X230 (WIRE) IMPLANT
LABEL OR SOLS (LABEL) ×4 IMPLANT
MANIFOLD NEPTUNE II (INSTRUMENTS) ×8 IMPLANT
NDL FILTER BLUNT 18X1 1/2 (NEEDLE) ×3 IMPLANT
NDL HYPO 25X1 1.5 SAFETY (NEEDLE) ×6 IMPLANT
NDL SAFETY ECLIPSE 18X1.5 (NEEDLE) ×3 IMPLANT
NEEDLE FILTER BLUNT 18X 1/2SAF (NEEDLE) ×1
NEEDLE FILTER BLUNT 18X1 1/2 (NEEDLE) ×3 IMPLANT
NEEDLE HYPO 18GX1.5 SHARP (NEEDLE) ×4
NEEDLE HYPO 25X1 1.5 SAFETY (NEEDLE) ×8 IMPLANT
NS IRRIG 1000ML POUR BTL (IV SOLUTION) ×6 IMPLANT
NS IRRIG 500ML POUR BTL (IV SOLUTION) ×1 IMPLANT
PACK ARTHROSCOPY KNEE (MISCELLANEOUS) ×4 IMPLANT
PACK EXTREMITY ARMC (MISCELLANEOUS) ×4 IMPLANT
PADDING CAST BLEND 4X4 NS (MISCELLANEOUS) ×4 IMPLANT
PENCIL ELECTRO HAND CTR (MISCELLANEOUS) ×4 IMPLANT
PUTTY DBX 5CC (Putty) ×4 IMPLANT
RASP SM TEAR CROSS CUT (RASP) ×1 IMPLANT
RESECTOR FULL RADIUS 2.9 (ORTHOPEDIC DISPOSABLE SUPPLIES) IMPLANT
SCREW CANN HDLS 7.0X65 (Screw) ×1 IMPLANT
SCREW CANN HDLS ST 7X72 (Screw) ×1 IMPLANT
SCREW COUNTERSINK 7.0 (MISCELLANEOUS) IMPLANT
SPLINT CAST 1 STEP 4X30 (MISCELLANEOUS) ×4 IMPLANT
SPLINT FAST PLASTER 5X30 (CAST SUPPLIES) ×1
SPLINT PLASTER CAST FAST 5X30 (CAST SUPPLIES) ×3 IMPLANT
SPONGE T-LAP 18X18 ~~LOC~~+RFID (SPONGE) ×4 IMPLANT
STAPLER SKIN PROX 35W (STAPLE) ×1 IMPLANT
STOCKINETTE M/LG 89821 (MISCELLANEOUS) ×4 IMPLANT
STRAP ANKLE FOOT DISTRACTOR (ORTHOPEDIC SUPPLIES) IMPLANT
STRAP SAFETY 5IN WIDE (MISCELLANEOUS) ×4 IMPLANT
STRIP CLOSURE SKIN 1/4X4 (GAUZE/BANDAGES/DRESSINGS) ×4 IMPLANT
SUT ETH BLK MONO 3 0 FS 1 12/B (SUTURE) ×4 IMPLANT
SUT ETHIBOND GREEN BRAID 0S 4 (SUTURE) ×16 IMPLANT
SUT ETHILON 3-0 FS-10 30 BLK (SUTURE) ×8
SUT ETHILON 4-0 (SUTURE) ×8
SUT ETHILON 4-0 FS2 18XMFL BLK (SUTURE) ×6
SUT PDS II 3-0 (SUTURE) ×4 IMPLANT
SUT VIC AB 3-0 SH 27 (SUTURE) ×8
SUT VIC AB 3-0 SH 27X BRD (SUTURE) ×3 IMPLANT
SUT VIC AB 4-0 FS2 27 (SUTURE) ×4 IMPLANT
SUT VIC AB 4-0 SH 27 (SUTURE) ×4
SUT VIC AB 4-0 SH 27XANBCTRL (SUTURE) ×3 IMPLANT
SUTURE EHLN 3-0 FS-10 30 BLK (SUTURE) ×3 IMPLANT
SUTURE ETHLN 4-0 FS2 18XMF BLK (SUTURE) ×3 IMPLANT
SYR 10ML LL (SYRINGE) ×4 IMPLANT
TOWEL OR 17X26 4PK STRL BLUE (TOWEL DISPOSABLE) ×4 IMPLANT
TUBING CONNECTING 10 (TUBING) ×4 IMPLANT
TUBING INFLOW SET DBFLO PUMP (TUBING) ×4 IMPLANT
TUBING OUTFLOW SET DBLFO PUMP (TUBING) ×4 IMPLANT
WAND ARTHRO PARAGON T2 (SURGICAL WAND) IMPLANT
WAND COVAC 50 IFS (MISCELLANEOUS) IMPLANT
WAND TOPAZ MICRO DEBRIDER (MISCELLANEOUS) IMPLANT
WATER STERILE IRR 500ML POUR (IV SOLUTION) ×4 IMPLANT
WIRE Z .062 C-WIRE SPADE TIP (WIRE) ×15 IMPLANT

## 2021-05-08 NOTE — Anesthesia Preprocedure Evaluation (Signed)
Anesthesia Evaluation  ?Patient identified by MRN, date of birth, ID band ?Patient awake ? ? ? ?Reviewed: ?Allergy & Precautions, NPO status , Patient's Chart, lab work & pertinent test results ? ?History of Anesthesia Complications ?Negative for: history of anesthetic complications ? ?Airway ?Mallampati: II ? ?TM Distance: >3 FB ?Neck ROM: Full ? ? ? Dental ?no notable dental hx. ?(+) Teeth Intact ?  ?Pulmonary ?neg pulmonary ROS, neg sleep apnea, neg COPD, Patient abstained from smoking.Not current smoker,  ?  ?Pulmonary exam normal ?breath sounds clear to auscultation ? ? ? ? ? ? Cardiovascular ?Exercise Tolerance: Good ?METS(-) hypertension(-) CAD and (-) Past MI negative cardio ROS ? ?(-) dysrhythmias  ?Rhythm:Regular Rate:Normal ?- Systolic murmurs ? ?  ?Neuro/Psych ?PSYCHIATRIC DISORDERS Anxiety Depression negative neurological ROS ?   ? GI/Hepatic ?neg GERD  ,(+)  ?  ? (-) substance abuse ? ,   ?Endo/Other  ?neg diabetes ? Renal/GU ?negative Renal ROS  ? ?  ?Musculoskeletal ? ?(+) Arthritis ,  ? Abdominal ?  ?Peds ? Hematology ?  ?Anesthesia Other Findings ?Past Medical History: ?No date: Ankle pain ?No date: Anxiety ?No date: Arthritis ?No date: Back pain ?No date: Depression ?No date: Vitamin D deficiency ? Reproductive/Obstetrics ? ?  ? ? ? ? ? ? ? ? ? ? ? ? ? ?  ?  ? ? ? ? ? ? ? ? ?Anesthesia Physical ?Anesthesia Plan ? ?ASA: 2 ? ?Anesthesia Plan: General  ? ?Post-op Pain Management: Regional block*, Ofirmev IV (intra-op)* and Toradol IV (intra-op)*  ? ?Induction: Intravenous ? ?PONV Risk Score and Plan: 3 and Ondansetron, Dexamethasone and Midazolam ? ?Airway Management Planned: Oral ETT ? ?Additional Equipment: None ? ?Intra-op Plan:  ? ?Post-operative Plan: Extubation in OR ? ?Informed Consent: I have reviewed the patients History and Physical, chart, labs and discussed the procedure including the risks, benefits and alternatives for the proposed anesthesia with the  patient or authorized representative who has indicated his/her understanding and acceptance.  ? ? ? ?Dental advisory given ? ?Plan Discussed with: CRNA and Surgeon ? ?Anesthesia Plan Comments: (Discussed risks of anesthesia with patient, including PONV, sore throat, lip/dental/eye damage. Rare risks discussed as well, such as cardiorespiratory and neurological sequelae, and allergic reactions. Discussed the role of CRNA in patient's perioperative care. Patient understands. ?Discussed r/b/a of poplieteal nerve block, including:  ?- bleeding, infection, nerve damage ?- poor or non functioning block. ?- reactions and toxicity to local anesthetic ?Patient understands. ?)  ? ? ? ? ? ? ?Anesthesia Quick Evaluation ? ?

## 2021-05-08 NOTE — Transfer of Care (Signed)
Immediate Anesthesia Transfer of Care Note ? ?Patient: Whitney Mcgee ? ?Procedure(s) Performed: ANKLE ARTHROSCOPY WITH DEBRIDEMENT; EXTENSIVE (Right: Ankle) ?ARTHRODESIS FOOT (Right: Foot) ?TARSAL TUNNEL RELEASE (Right) ? ?Patient Location: PACU ? ?Anesthesia Type:General ? ?Level of Consciousness: awake, drowsy and patient cooperative ? ?Airway & Oxygen Therapy: Patient Spontanous Breathing and Patient connected to face mask oxygen ? ?Post-op Assessment: Report given to RN and Post -op Vital signs reviewed and stable ? ?Post vital signs: Reviewed and stable ? ?Last Vitals:  ?Vitals Value Taken Time  ?BP 107/52 05/08/21 1603  ?Temp 37 ?C 05/08/21 1603  ?Pulse 89 05/08/21 1606  ?Resp 17 05/08/21 1606  ?SpO2 100 % 05/08/21 1606  ?Vitals shown include unvalidated device data. ? ?Last Pain:  ?Vitals:  ? 05/08/21 1111  ?TempSrc: Tympanic  ?   ? ?  ? ?Complications: No notable events documented. ?

## 2021-05-08 NOTE — Anesthesia Procedure Notes (Signed)
Anesthesia Regional Block: Popliteal block  ? ?Pre-Anesthetic Checklist: , timeout performed,  Correct Patient, Correct Site, Correct Laterality,  Correct Procedure, Correct Position, site marked,  Risks and benefits discussed,  Surgical consent,  Pre-op evaluation,  At surgeon's request and post-op pain management ? ?Laterality: Lower and Right ? ?Prep: chloraprep     ?  ?Needles:  ?Injection technique: Single-shot ? ?Needle Type: Echogenic Needle   ? ? ?Needle Length: 9cm  ?Needle Gauge: 21  ? ? ? ?Additional Needles: ? ? ?Procedures:,,,, ultrasound used (permanent image in chart),,    ?Narrative:  ?Injection made incrementally with aspirations every 5 mL. ? ?Performed by: Personally  ?Anesthesiologist: Arita Miss, MD ? ?Additional Notes: ?Patient's chart reviewed and they were deemed appropriate candidate for procedure, at surgeon's request. Patient educated about risks, benefits, and alternatives of the block including but not limited to: temporary or permanent nerve damage, bleeding, infection, damage to surround tissues, block failure, local anesthetic toxicity. Patient expressed understanding. A formal time-out was conducted consistent with institution rules. ? ?Monitors were applied, and minimal sedation used. The site was prepped with skin prep and allowed to dry, and sterile gloves were used. A high frequency linear ultrasound probe with probe cover was utilized throughout. Popliteal artery pulsatile and visualized in popliteal fossa along with adjacent sciatic nerve and its branch point, which appeared anatomically normal, local anesthetic injected around them just proximal to the branch point, and echogenic block needle trajectory was monitored throughout. Aspiration performed every 87m. Blood vessels were avoided. All injections were performed without resistance and free of blood and paresthesias. The patient tolerated the procedure well. ? ?Injectate: 291m0.3% ropivacaine + '2mg'$  decadron ? ? ? ? ?

## 2021-05-08 NOTE — Op Note (Signed)
Operative note ? ? Surgeon:Brenda Cowher Vickki Muff ? ?  Assistant: None ? ?  Preop diagnosis: 1.  Ankle arthritis right ankle 2.  Retained painful hardware anterior talus medial and lateral 3.  Tarsal tunnel syndrome right medial ankle 4.  Subtalar joint arthritis 5.  Exostosis distal anterior fibula ? ?  Postop diagnosis: Same ? ?  Procedure: 1.  Ankle arthroscopy with extensive debridement right ankle 2.  Open ankle arthroplasty anterior right ankle 3.  Removal of retained screw from medial and lateral aspect of talus 4.  Tarsal tunnel release right medial ankle 5.  Subtalar joint arthrodesis right heel 5.  Excision of large exostosis distal anterior fibula ? ?  EBL: Minimal ? ?  Anesthesia:regional and general.  A popliteal block had been placed preoperatively. ? ?  Hemostasis: Mid calf tourniquet inflated to 200 mmHg for a total of 120 minutes. ? ?  Specimen: None ? ?  Complications: None ? ?  Operative indications:Whitney Mcgee is an 56 y.o. that presents today for surgical intervention.  The risks/benefits/alternatives/complications have been discussed and consent has been given. ? ?  Procedure:  ?Patient was brought into the OR and placed on the operating table in thesupine position. After anesthesia was obtained theright lower extremity was prepped and draped in usual sterile fashion. ? ?Attention was initially directed to the ant aspect of the ankle where the ankle arthroscopy was performed.  The anteromedial and lateral portals were created.  Blunt dissection carried down to the anterior aspect of the ankle.  Extensive debridement of soft tissue and bone was performed.  The ankle joint was evaluated and there was noted to be moderate thinning of the cartilage in the ankle joint.  Large anterior osteophytes were noted.  At this time the anteromedial and anterolateral aspect of the ankle joint were then dissected.  The anteromedial and lateral incision sites were connected.  The large spur on the anterior aspect of  the talus as well as the tibia was then excised with a power rasp.  The anterolateral and anteromedial screws were then removed at this time.  These were removed in toto.  Good range of motion of the ankle joint was noted and these incisions were closed in layers with 3-0 Vicryl for the deeper and subcutaneous tissue and a 3-0 nylon for skin. ? ?Attention was directed to the medial aspect of the ankle.  The tourniquet was deflated.  Incision was made along the tarsal tunnel.  Sharp and blunt dissection carried down to the flexor retinaculum.  At this time the neurovascular bundles were noted.  The tibial nerve was noted and dissected away from all scar tissue.  A large amount of scar tissue was noted around the nerve.  This was freed appropriately.  There were some multiple venous structures within the area that were freed away from the nerve.  The wound was flushed with copious amounts of irrigation and closure of the subcutaneous tissue and skin were then performed. ? ?Attention was directed laterally at the subtalar joint where an Ollier incision was performed.  Sharp and blunt dissection carried down to the subtalar joint.  The extensor digitorum brevis was reflected distally.  There was noted to be a very large osteophyte just anterior to the fibula and superior to the subtalar joint.  It was just lateral to the talar body.  This large osteophyte was then removed from the surgical field.  This freed up the subtalar joint quite nicely.  I was then able  to evaluate the subtalar joint and severe end-stage cartilage damage was noted with very minimal cartilage left.  This was scraped with a curette and burred with a power bur.  It was then drilled with a 2 oh millimeter drill bit.  This was packed with DBX putty.  Two 7.0 millimeter screws were driven from the posterior aspect of the calcaneus crossing the subtalar joint with good compression and stability noted.  This was noted to be in good position in all  planes.  The incision was flushed.  Closure was then performed with a 3-0 Vicryl for the deeper tissues and subcutaneous tissue and skin staples for the skin both lateral and at the screw placement site.  A bulky sterile dressing was applied.  Patient was placed in an equalizer walker boot with the foot in a 90 degree position neutral. ? ?  Patient tolerated the procedure and anesthesia well.  Was transported from the OR to the PACU with all vital signs stable and vascular status intact. To be discharged per routine protocol.  Will follow up in approximately 1 week in the outpatient clinic. ? ?

## 2021-05-08 NOTE — Discharge Instructions (Addendum)
[facility] ? ?POST OPERATIVE INSTRUCTIONS FOR DR. Vickki Muff AND DR. BAKER ?[place] ? ? ?Take your medication as prescribed.  Pain medication should be taken only as needed. ? ?Keep the dressing clean, dry and intact. ? ?Keep your foot elevated above the heart level for the first 48 hours. ? ?We have instructed you to be non-weight bearing. ? ?Always wear your post-op shoe when walking.  Always use your crutches if you are to be non-weight bearing. ? ?Do not take a shower. Baths are permissible as long as the foot is kept out of the water.  ? ?Every hour you are awake:  ?Bend your knee 15 times. ?Massage calf 15 times ? ?Call [facility] [phone]) if any of the following problems occur: ?You develop a temperature or fever. ?The bandage becomes saturated with blood. ?Medication does not stop your pain. ?Injury of the foot occurs. ?Any symptoms of infection including redness, odor, or red streaks running from wound.  ? ?AMBULATORY SURGERY  ?DISCHARGE INSTRUCTIONS ? ? ?The drugs that you were given will stay in your system until tomorrow so for the next 24 hours you should not: ? ?Drive an automobile ?Make any legal decisions ?Drink any alcoholic beverage ? ? ?You may resume regular meals tomorrow.  Today it is better to start with liquids and gradually work up to solid foods. ? ?You may eat anything you prefer, but it is better to start with liquids, then soup and crackers, and gradually work up to solid foods. ? ? ?Please notify your doctor immediately if you have any unusual bleeding, trouble breathing, redness and pain at the surgery site, drainage, fever, or pain not relieved by medication. ? ? ? ?Additional Instructions: ? ?Please contact your physician with any problems or Same Day Surgery at [phone], Monday through Friday 6 am to 4 pm, or [place] [facility] number at [phone].  ?

## 2021-05-08 NOTE — Anesthesia Postprocedure Evaluation (Signed)
Anesthesia Post Note ? ?Patient: Whitney Mcgee ? ?Procedure(s) Performed: ANKLE ARTHROSCOPY WITH DEBRIDEMENT; EXTENSIVE (Right: Ankle) ?ARTHRODESIS FOOT (Right: Foot) ?TARSAL TUNNEL RELEASE (Right) ? ?Patient location during evaluation: PACU ?Anesthesia Type: General ?Level of consciousness: awake and alert ?Pain management: pain level controlled ?Vital Signs Assessment: post-procedure vital signs reviewed and stable ?Respiratory status: spontaneous breathing, nonlabored ventilation, respiratory function stable and patient connected to nasal cannula oxygen ?Cardiovascular status: blood pressure returned to baseline and stable ?Postop Assessment: no apparent nausea or vomiting ?Anesthetic complications: no ? ? ?No notable events documented. ? ? ?Last Vitals:  ?Vitals:  ? 05/08/21 1645 05/08/21 1700  ?BP: 114/63 (!) 103/58  ?Pulse: 81 78  ?Resp: 10 10  ?Temp: (!) 36.3 ?C   ?SpO2: 98% 98%  ?  ?Last Pain:  ?Vitals:  ? 05/08/21 1700  ?TempSrc:   ?PainSc: 3   ? ? ?  ?  ?  ?  ?  ?  ? ?Arita Miss ? ? ? ? ?

## 2021-05-08 NOTE — OR Nursing (Signed)
2 screws removed from right ankle ?

## 2021-05-08 NOTE — Anesthesia Procedure Notes (Signed)
Procedure Name: Intubation ?Date/Time: 05/08/2021 11:55 AM ?Performed by: Jerrye Noble, CRNA ?Pre-anesthesia Checklist: Patient identified, Emergency Drugs available, Suction available and Patient being monitored ?Patient Re-evaluated:Patient Re-evaluated prior to induction ?Oxygen Delivery Method: Circle system utilized ?Preoxygenation: Pre-oxygenation with 100% oxygen ?Induction Type: IV induction ?Ventilation: Mask ventilation without difficulty ?Laryngoscope Size: McGraph and 3 ?Grade View: Grade I ?Tube type: Oral ?Tube size: 6.5 mm ?Number of attempts: 1 ?Airway Equipment and Method: Stylet, Oral airway and Video-laryngoscopy ?Placement Confirmation: ETT inserted through vocal cords under direct vision, positive ETCO2 and breath sounds checked- equal and bilateral ?Secured at: 22 cm ?Tube secured with: Tape ?Dental Injury: Teeth and Oropharynx as per pre-operative assessment  ? ? ? ? ?

## 2021-05-18 ENCOUNTER — Encounter: Payer: Self-pay | Admitting: Podiatry

## 2021-05-29 ENCOUNTER — Encounter: Payer: Self-pay | Admitting: Podiatry

## 2021-06-01 ENCOUNTER — Ambulatory Visit
Admission: RE | Admit: 2021-06-01 | Discharge: 2021-06-01 | Disposition: A | Discharge status: Home / Self Care | Payer: BC Managed Care – PPO | Source: Ambulatory Visit | Attending: Family Medicine | Admitting: Family Medicine

## 2021-06-01 DIAGNOSIS — Z1231 Encounter for screening mammogram for malignant neoplasm of breast: Secondary | ICD-10-CM | POA: Insufficient documentation

## 2021-06-01 DIAGNOSIS — Z780 Asymptomatic menopausal state: Secondary | ICD-10-CM | POA: Diagnosis present

## 2021-06-02 ENCOUNTER — Other Ambulatory Visit: Payer: Self-pay | Admitting: Family Medicine

## 2021-06-02 DIAGNOSIS — R928 Other abnormal and inconclusive findings on diagnostic imaging of breast: Secondary | ICD-10-CM

## 2021-06-02 DIAGNOSIS — N6489 Other specified disorders of breast: Secondary | ICD-10-CM

## 2021-06-17 ENCOUNTER — Ambulatory Visit
Admission: RE | Admit: 2021-06-17 | Discharge: 2021-06-17 | Disposition: A | Discharge status: Home / Self Care | Payer: BC Managed Care – PPO | Source: Ambulatory Visit | Attending: Family Medicine | Admitting: Family Medicine

## 2021-06-17 DIAGNOSIS — R928 Other abnormal and inconclusive findings on diagnostic imaging of breast: Secondary | ICD-10-CM

## 2021-06-17 DIAGNOSIS — N6489 Other specified disorders of breast: Secondary | ICD-10-CM | POA: Diagnosis present

## 2021-11-16 ENCOUNTER — Other Ambulatory Visit: Payer: Self-pay | Admitting: Family Medicine

## 2021-11-19 ENCOUNTER — Other Ambulatory Visit: Payer: Self-pay | Admitting: Family Medicine

## 2021-11-19 DIAGNOSIS — N63 Unspecified lump in unspecified breast: Secondary | ICD-10-CM

## 2021-12-21 ENCOUNTER — Ambulatory Visit
Admission: RE | Admit: 2021-12-21 | Discharge: 2021-12-21 | Disposition: A | Discharge status: Home / Self Care | Payer: BC Managed Care – PPO | Source: Ambulatory Visit | Attending: Family Medicine | Admitting: Family Medicine

## 2021-12-21 DIAGNOSIS — N63 Unspecified lump in unspecified breast: Secondary | ICD-10-CM | POA: Diagnosis not present

## 2022-04-14 IMAGING — CT CT ANKLE*R* W/O CM
3 of 6 series · 11 of 33 positions shown, 13 images · non-contrast
Comparison: None.

CLINICAL DATA: Chronic ankle pain. Status post prior remote trauma.

EXAM:
CT OF THE RIGHT ANKLE WITHOUT CONTRAST
TECHNIQUE: Multidetector CT imaging of the right ankle was performed according
to the standard protocol. Multiplanar CT image reconstructions were
also generated.

[Series 4: axial bone · axial · 0.18mm/px · z∈[+140,+236]mm · 5 of 146 slices shown, 7 images]
[im 25/146  soft-tissue]
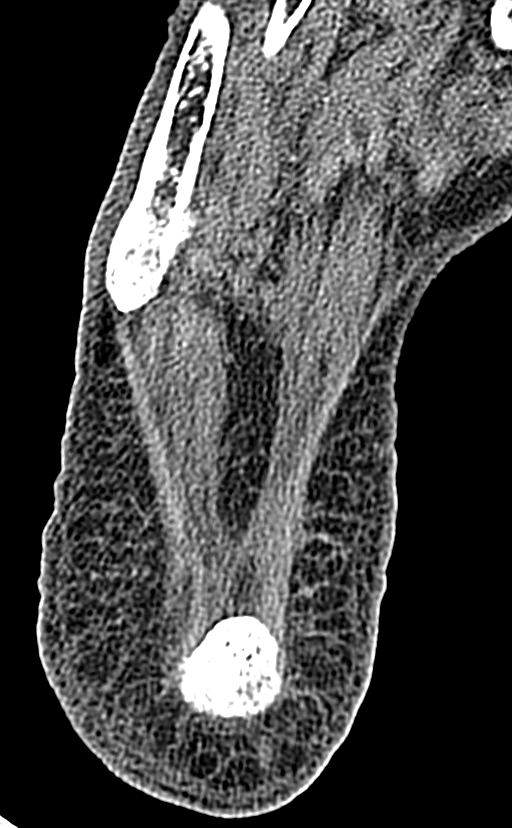
[im 25/146  bone]
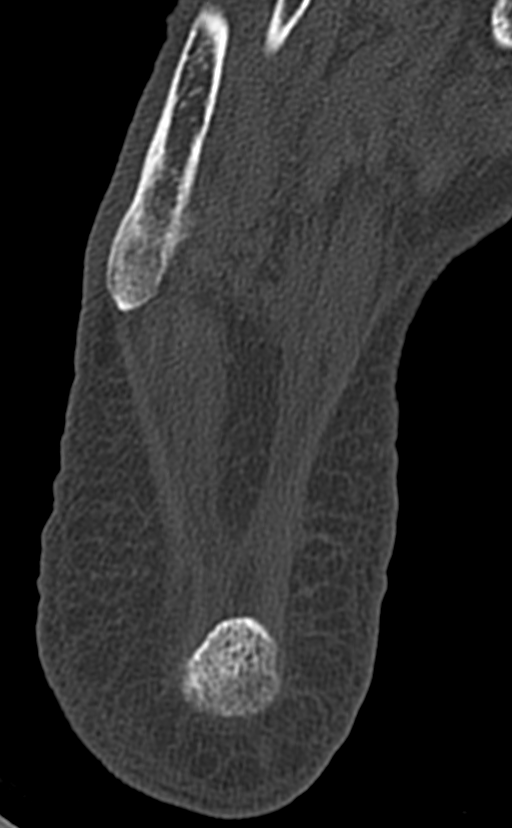
[im 49/146  bone]
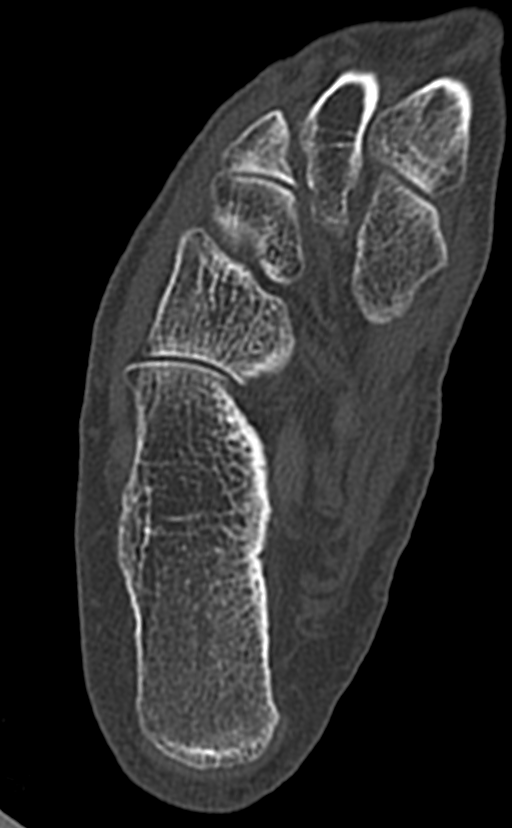
[im 73/146  bone]
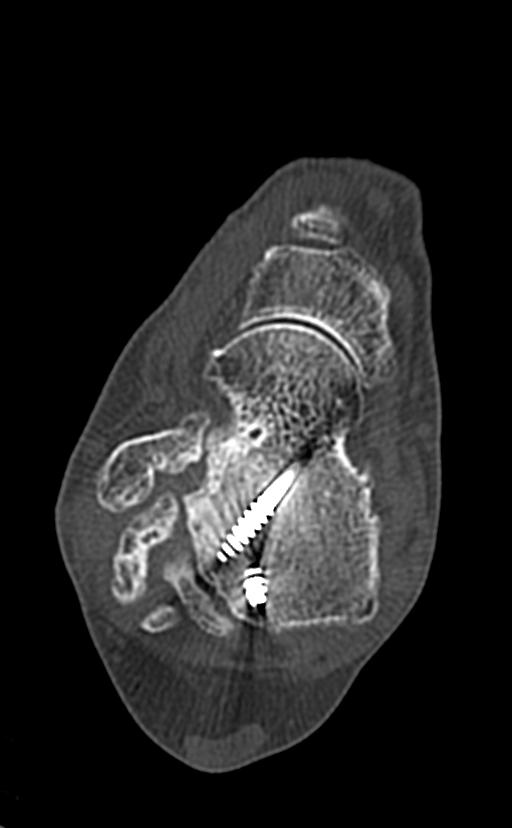
[im 97/146  bone]
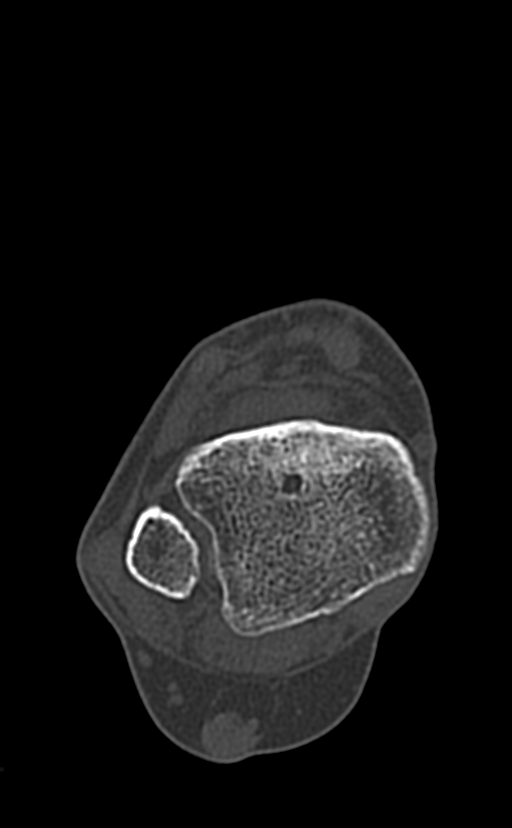
[im 121/146  soft-tissue]
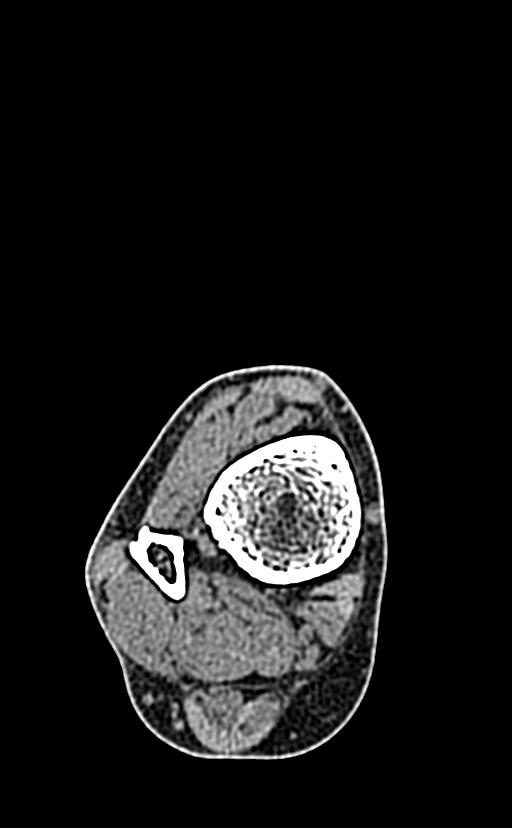
[im 121/146  bone]
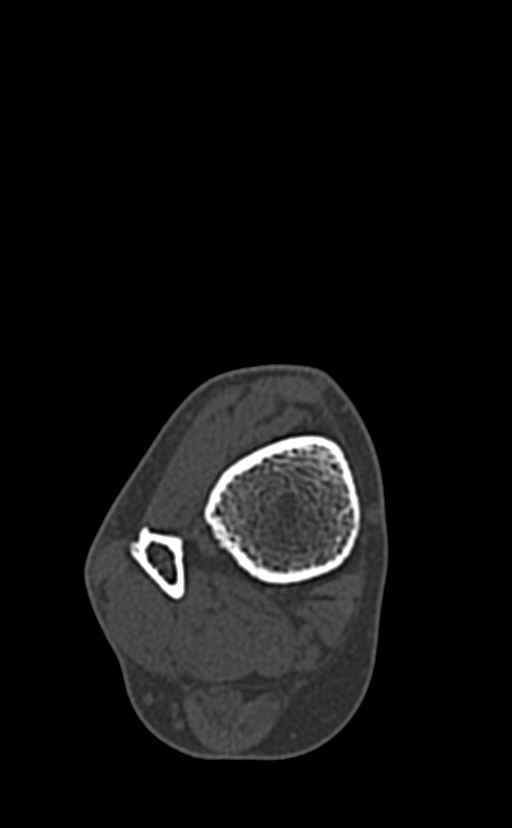

[Series 5: cor bone · coronal · 0.18mm/px · 1 of 153 slices shown]
[im 77/153  bone]
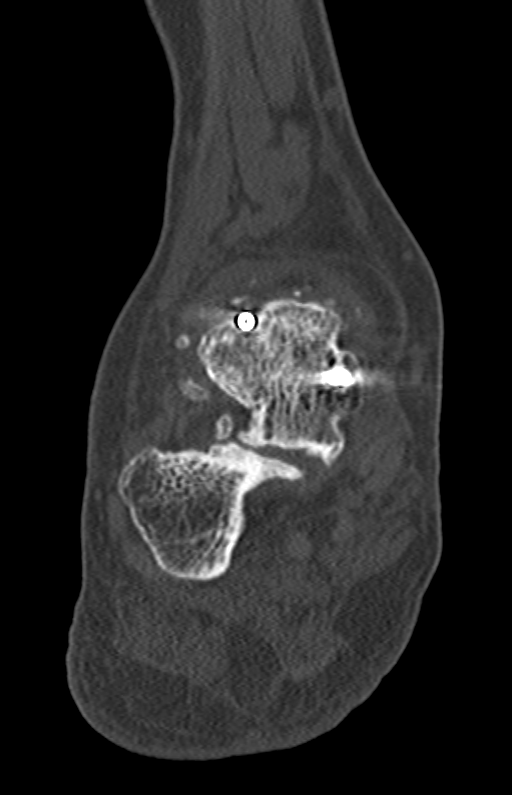

[Series 6: sag bone · sagittal · 0.29mm/px · 5 of 95 slices shown]
[im 16/95  bone]
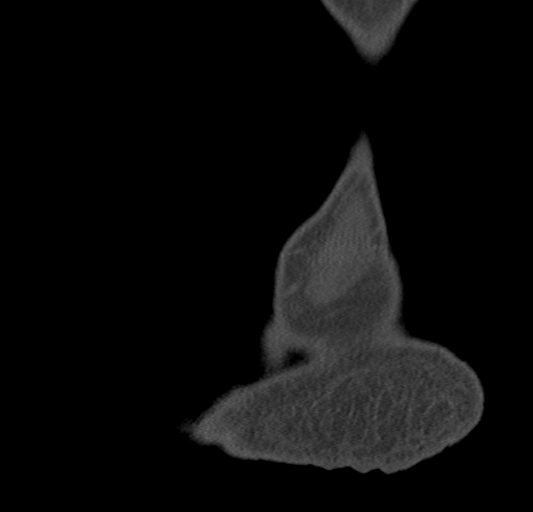
[im 32/95  bone]
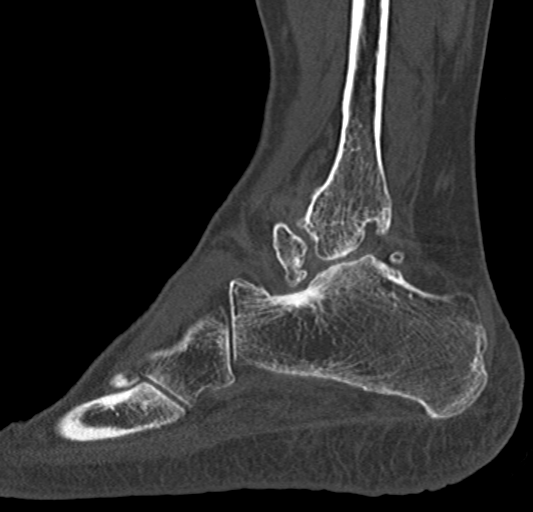
[im 48/95  bone]
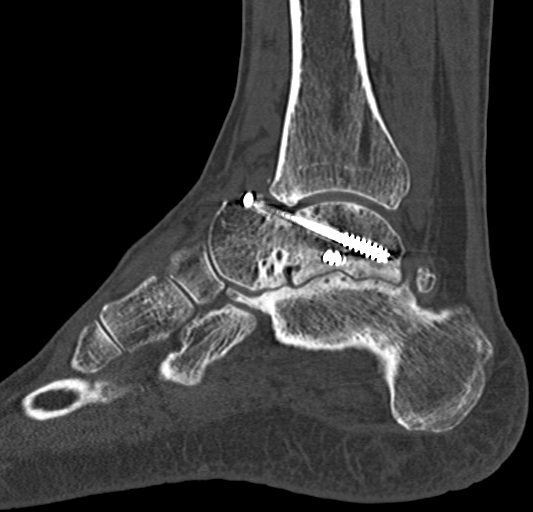
[im 63/95  bone]
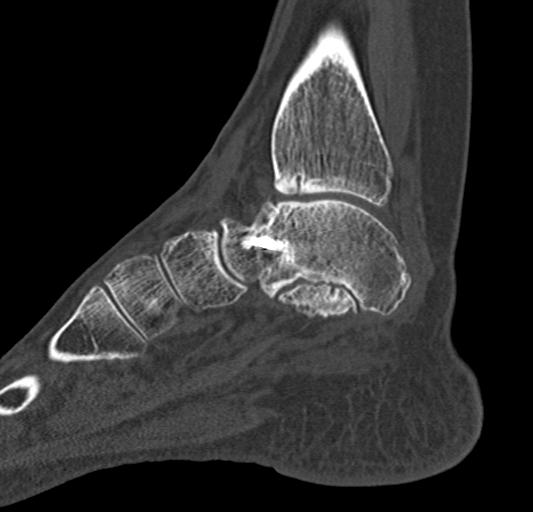
[im 79/95  bone]
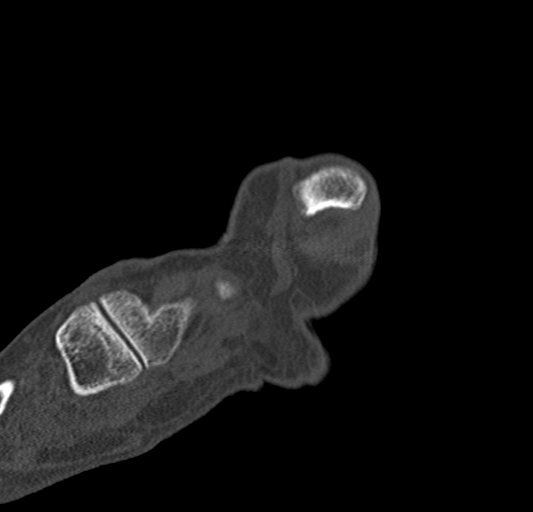

[11 of 33 positions shown; findings below may reference images not displayed]

FINDINGS: Bones/Joint/Cartilage

No acute fracture or dislocation.

Healed fracture of the talar neck transfixed with 2 screws. The
screw inserted through the medial aspect of the anterior talus
courses obliquely terminating along the posterior facet with the
screw partially exposed through the articular surface. Severe
osteoarthritis of the subtalar joints. The second screw directed
anterior to posterior from anterior approach is partially exposed to
the anterior lip of the tibial plafond with remodeling of the
anterior left of the distal tibia.

Large bony fragment along the distal anterior aspect of the lateral
malleolus likely reflecting and ununited fracture fragment versus
heterotopic calcification. Multiple smaller bony fragments are noted
along the distal fibula. Subcortical extra-articular reactive marrow
changes involving the distal fibula and lateral calcaneus as can be
seen with posterolateral ankle impingement.

No joint effusion.

Ligaments

Ligaments are suboptimally evaluated by CT.

Muscles and Tendons
Muscles are normal. No muscle atrophy. Achilles tendon is grossly
intact. Flexor and extensor compartment tendons are intact. Lateral
subluxation of the peroneal tendons. Expansion of the peroneus
longus concerning for tendinosis.

Soft tissue
No fluid collection or hematoma. No soft tissue mass.
IMPRESSION: 1. Healed fracture of the talar neck transfixed with 2 screws. The
screw inserted through the medial aspect of the anterior talus
courses obliquely terminating along the posterior facet with the
screw partially exposed through the articular surface. Severe
advanced osteoarthritis of the subtalar joints.
2. Subcortical extra-articular reactive marrow changes involving the
distal fibula and lateral calcaneus as can be seen with
posterolateral ankle impingement.
3. Lateral subluxation of the peroneal tendons. Expansion of the
peroneus longus concerning for tendinosis.

## 2022-06-08 ENCOUNTER — Other Ambulatory Visit: Payer: Self-pay | Admitting: Family Medicine

## 2022-06-08 DIAGNOSIS — Z1231 Encounter for screening mammogram for malignant neoplasm of breast: Secondary | ICD-10-CM

## 2022-08-18 IMAGING — MG MM DIGITAL DIAGNOSTIC UNILAT*R* W/ TOMO W/ CAD
4 series · 4 of 12 positions shown · non-contrast
Comparison: Previous exam(s).

CLINICAL DATA: Recall from screening mammography, mass-like
asymmetry in the inner RIGHT breast at anterior depth visible only
on the CC view.

EXAM:
DIGITAL DIAGNOSTIC UNILATERAL RIGHT MAMMOGRAM WITH TOMOSYNTHESIS AND
CAD; ULTRASOUND RIGHT BREAST LIMITED
TECHNIQUE: Right digital diagnostic mammography and breast tomosynthesis was
performed. The images were evaluated with computer-aided detection.;
Targeted ultrasound examination of the right breast was performed

[R ML synth-2D]
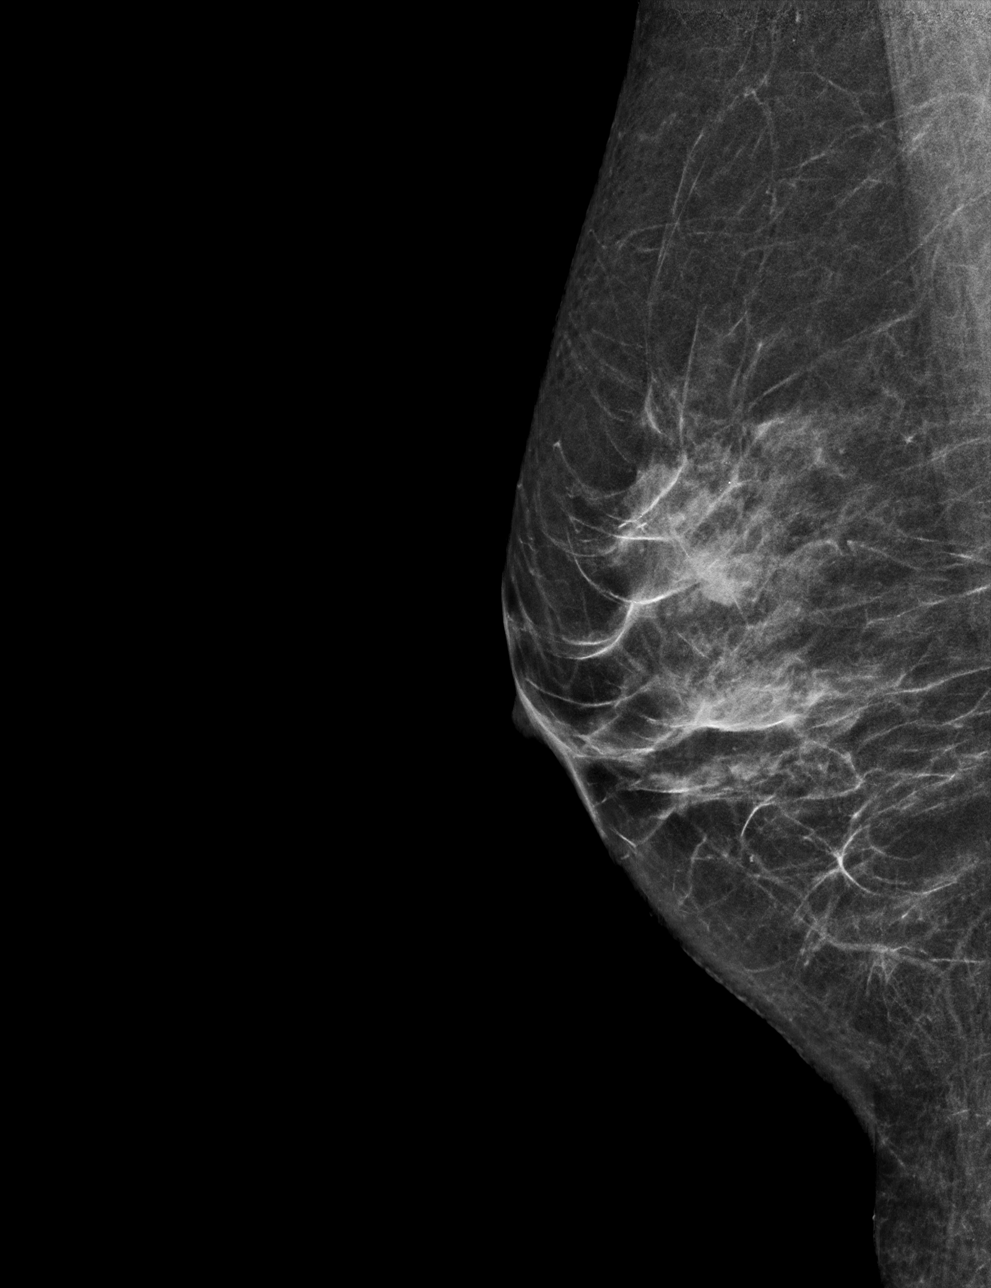

[R CC synth-2D]
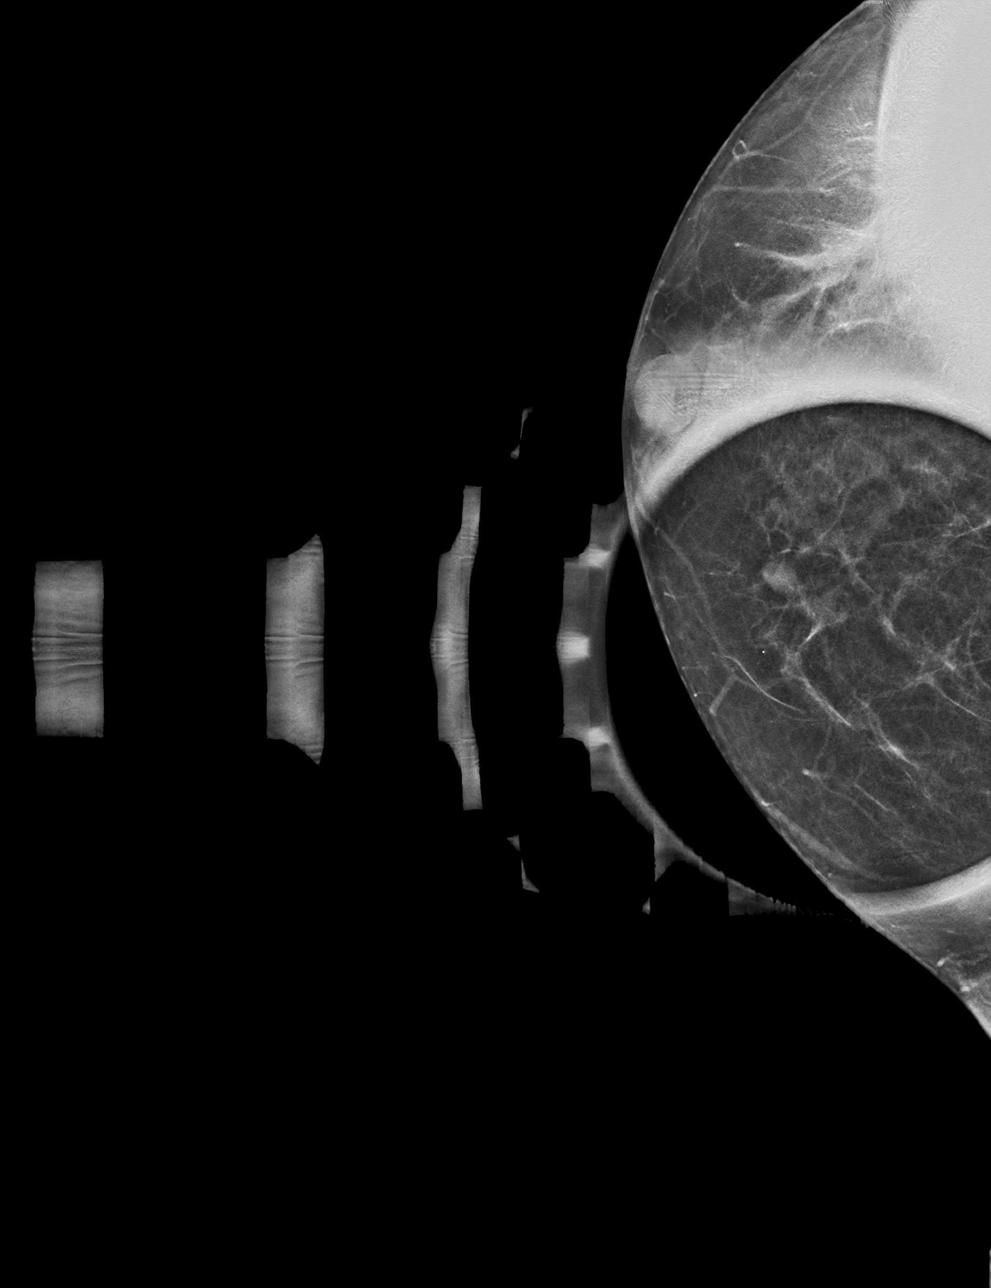

[R ML tomo · tomo slice 28/55.0]
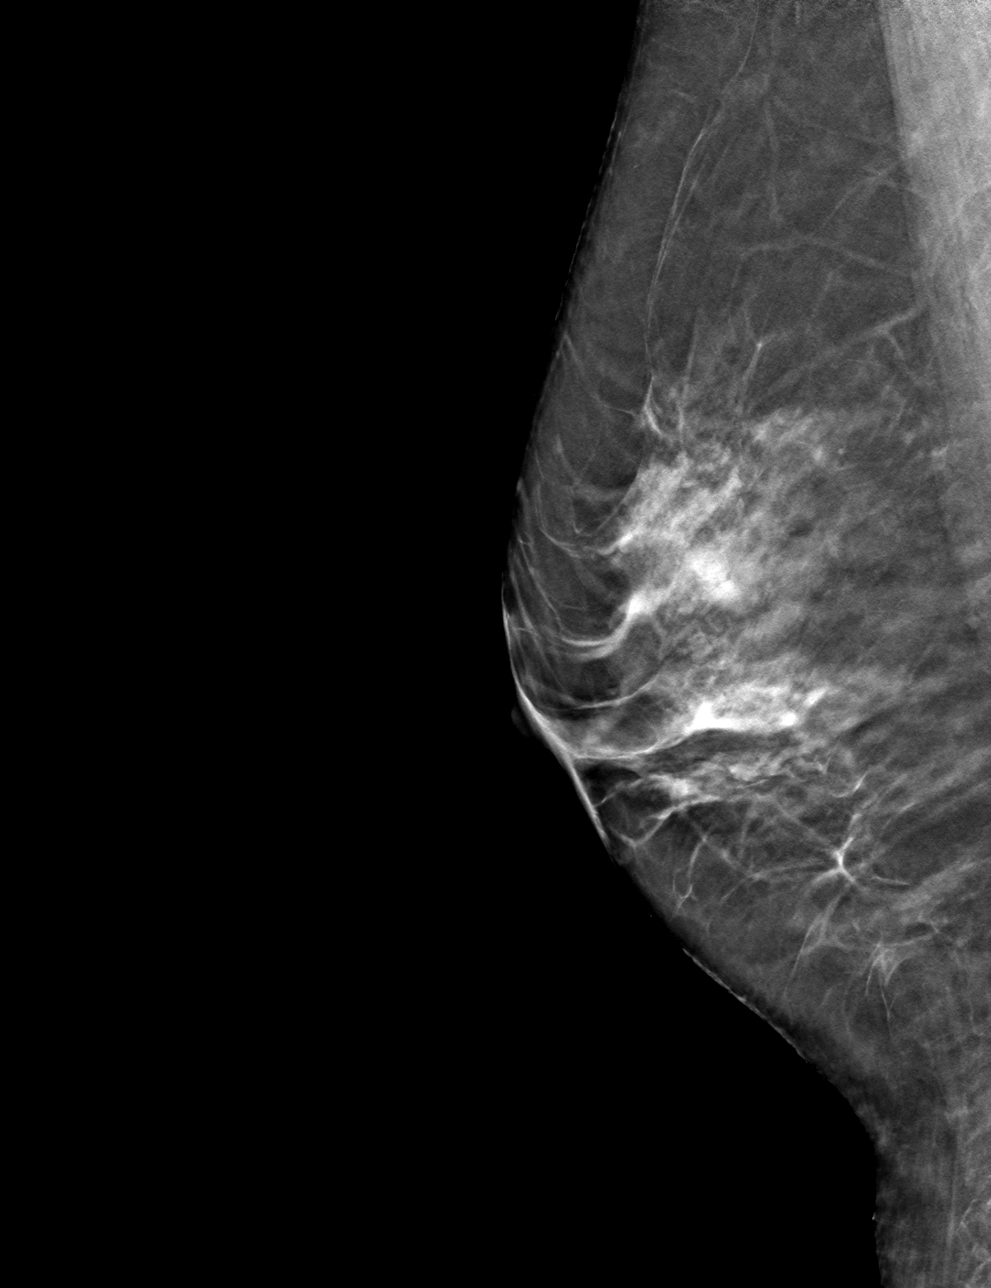

[R CC tomo · tomo slice 21/41.0]
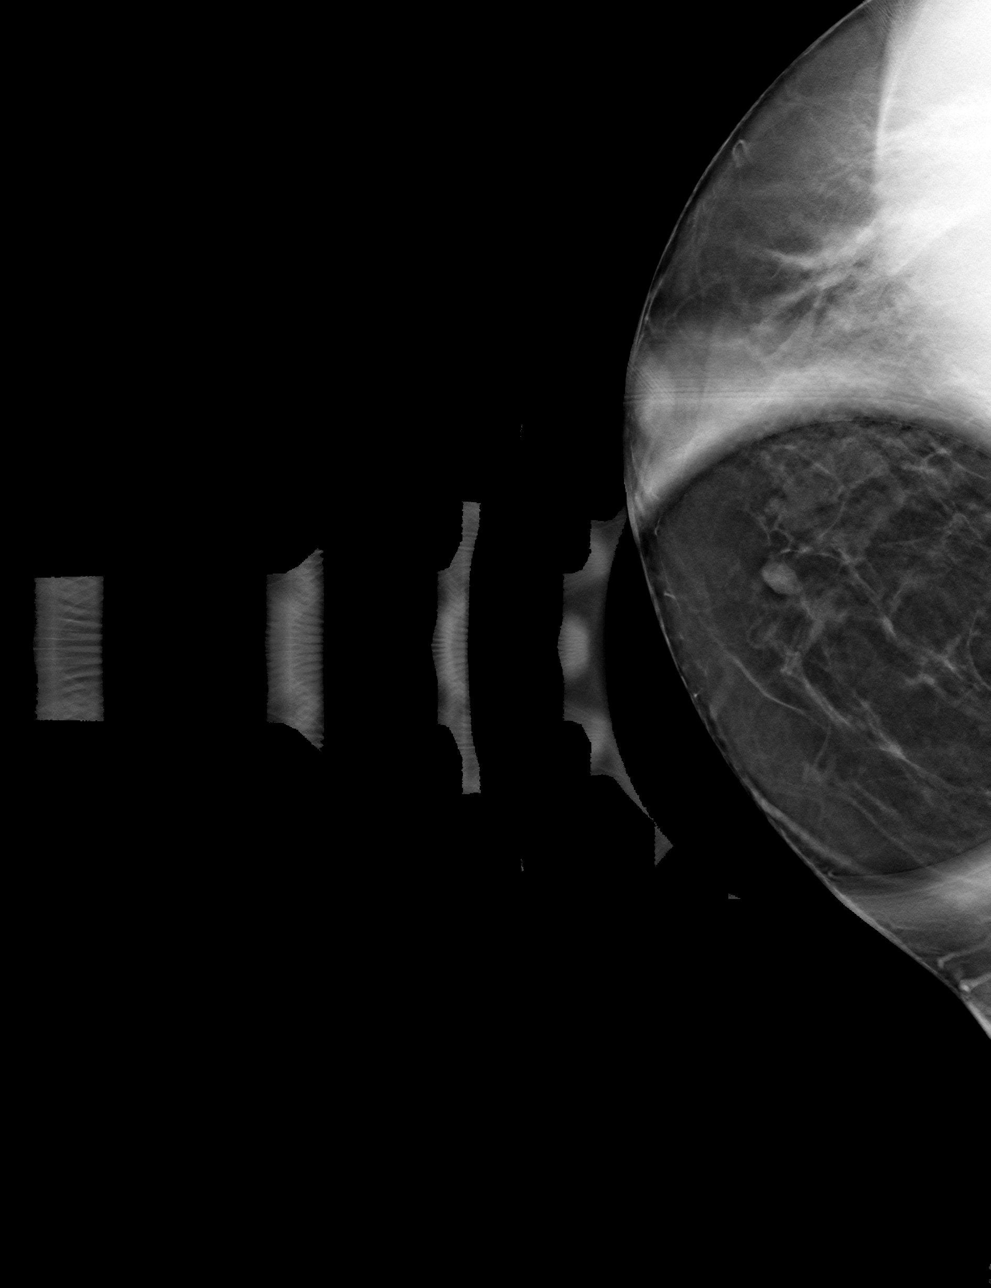

[4 of 12 positions shown; findings below may reference images not displayed]

ACR Breast Density Category c: The breast tissue is heterogeneously
dense, which may obscure small masses.
FINDINGS: Spot-compression CC view of the area of concern and a full field
mediolateral view were obtained.

The spot compression view confirms a circumscribed oval isodense
mass measuring approximately 0.7 cm in the inner breast at anterior
depth. There is no associated architectural distortion or suspicious
calcifications. The mass localizes to the slight upper breast on the
full field mediolateral view, indicating its location is likely in
the 2-3 o'clock position.

Targeted ultrasound is performed, demonstrating an oval parallel
circumscribed anechoic mass with scattered internal echoes at the 2
o'clock position 3 cm from the nipple at anterior depth, measuring
approximately 0.7 x 0.4 x 0.5 cm, demonstrating posterior acoustic
enhancement and no internal color Doppler flow, corresponding to the
screening mammographic finding.

Sonographic evaluation of the RIGHT axilla demonstrates no
pathologic lymphadenopathy.
IMPRESSION: 1. Likely benign 0.7 cm complicated cyst in the UPPER INNER QUADRANT
of the RIGHT breast at 2 o'clock 3 cm from the nipple at anterior
depth.
2. No pathologic RIGHT axillary lymphadenopathy.

RECOMMENDATION:
RIGHT breast ultrasound in 6 months.

I have discussed the findings and recommendations with the patient.
If applicable, a reminder letter will be sent to the patient
regarding the next appointment.

BI-RADS CATEGORY  3: Probably benign.

## 2023-06-24 ENCOUNTER — Other Ambulatory Visit: Payer: Self-pay | Admitting: Family Medicine

## 2023-06-24 DIAGNOSIS — Z1231 Encounter for screening mammogram for malignant neoplasm of breast: Secondary | ICD-10-CM

## 2023-06-27 ENCOUNTER — Other Ambulatory Visit: Payer: Self-pay | Admitting: Family Medicine

## 2023-06-27 DIAGNOSIS — Z1231 Encounter for screening mammogram for malignant neoplasm of breast: Secondary | ICD-10-CM

## 2023-07-12 ENCOUNTER — Inpatient Hospital Stay: Admission: RE | Admit: 2023-07-12 | Payer: Self-pay | Source: Ambulatory Visit

## 2023-08-01 ENCOUNTER — Other Ambulatory Visit: Payer: Self-pay | Admitting: Family Medicine

## 2023-08-01 ENCOUNTER — Encounter: Payer: Self-pay | Admitting: Family Medicine

## 2023-08-01 DIAGNOSIS — Z1231 Encounter for screening mammogram for malignant neoplasm of breast: Secondary | ICD-10-CM

## 2023-08-01 DIAGNOSIS — M85851 Other specified disorders of bone density and structure, right thigh: Secondary | ICD-10-CM

## 2023-09-12 ENCOUNTER — Ambulatory Visit

## 2023-09-12 ENCOUNTER — Other Ambulatory Visit

## 2023-10-17 ENCOUNTER — Ambulatory Visit
Admission: RE | Admit: 2023-10-17 | Discharge: 2023-10-17 | Disposition: A | Discharge status: Home / Self Care | Source: Ambulatory Visit | Attending: Family Medicine | Admitting: Family Medicine

## 2023-10-17 DIAGNOSIS — Z1231 Encounter for screening mammogram for malignant neoplasm of breast: Secondary | ICD-10-CM | POA: Insufficient documentation

## 2023-10-17 DIAGNOSIS — M85851 Other specified disorders of bone density and structure, right thigh: Secondary | ICD-10-CM | POA: Insufficient documentation
